# Patient Record
Sex: Male | Born: 1970 | Race: White | Hispanic: No | Marital: Married | Smoking: Never smoker
Health system: Southern US, Community
[De-identification: ages and names within clinical notes are randomized; demographics above are authoritative.]

## PROBLEM LIST (undated history)

## (undated) DIAGNOSIS — B019 Varicella without complication: Secondary | ICD-10-CM

## (undated) DIAGNOSIS — J069 Acute upper respiratory infection, unspecified: Secondary | ICD-10-CM

## (undated) DIAGNOSIS — T783XXA Angioneurotic edema, initial encounter: Secondary | ICD-10-CM

## (undated) DIAGNOSIS — N200 Calculus of kidney: Secondary | ICD-10-CM

## (undated) DIAGNOSIS — I10 Essential (primary) hypertension: Secondary | ICD-10-CM

## (undated) DIAGNOSIS — T7840XA Allergy, unspecified, initial encounter: Secondary | ICD-10-CM

## (undated) HISTORY — DX: Varicella without complication: B01.9

## (undated) HISTORY — DX: Calculus of kidney: N20.0

## (undated) HISTORY — DX: Allergy, unspecified, initial encounter: T78.40XA

## (undated) HISTORY — DX: Acute upper respiratory infection, unspecified: J06.9

## (undated) HISTORY — DX: Angioneurotic edema, initial encounter: T78.3XXA

## (undated) HISTORY — PX: KNEE SURGERY: SHX244

---

## 2014-12-21 ENCOUNTER — Encounter (HOSPITAL_COMMUNITY): Payer: Self-pay | Admitting: Emergency Medicine

## 2014-12-21 ENCOUNTER — Emergency Department (HOSPITAL_COMMUNITY): Payer: Worker's Compensation

## 2014-12-21 ENCOUNTER — Emergency Department (HOSPITAL_COMMUNITY)
Admission: EM | Admit: 2014-12-21 | Discharge: 2014-12-21 | Disposition: A | Discharge status: Home / Self Care | Payer: Worker's Compensation | Attending: Emergency Medicine | Admitting: Emergency Medicine

## 2014-12-21 DIAGNOSIS — I10 Essential (primary) hypertension: Secondary | ICD-10-CM | POA: Diagnosis not present

## 2014-12-21 DIAGNOSIS — Z87891 Personal history of nicotine dependence: Secondary | ICD-10-CM | POA: Insufficient documentation

## 2014-12-21 DIAGNOSIS — Y9289 Other specified places as the place of occurrence of the external cause: Secondary | ICD-10-CM | POA: Diagnosis not present

## 2014-12-21 DIAGNOSIS — Y9389 Activity, other specified: Secondary | ICD-10-CM | POA: Diagnosis not present

## 2014-12-21 DIAGNOSIS — W000XXA Fall on same level due to ice and snow, initial encounter: Secondary | ICD-10-CM | POA: Insufficient documentation

## 2014-12-21 DIAGNOSIS — S79912A Unspecified injury of left hip, initial encounter: Secondary | ICD-10-CM | POA: Insufficient documentation

## 2014-12-21 DIAGNOSIS — Z880 Allergy status to penicillin: Secondary | ICD-10-CM | POA: Insufficient documentation

## 2014-12-21 DIAGNOSIS — Y998 Other external cause status: Secondary | ICD-10-CM | POA: Diagnosis not present

## 2014-12-21 DIAGNOSIS — S5002XA Contusion of left elbow, initial encounter: Secondary | ICD-10-CM | POA: Diagnosis not present

## 2014-12-21 DIAGNOSIS — W102XXA Fall (on)(from) incline, initial encounter: Secondary | ICD-10-CM

## 2014-12-21 DIAGNOSIS — M25552 Pain in left hip: Secondary | ICD-10-CM

## 2014-12-21 HISTORY — DX: Essential (primary) hypertension: I10

## 2014-12-21 MED ORDER — IBUPROFEN 800 MG PO TABS: 800.0000 mg | ORAL_TABLET | Freq: Three times a day (TID) | ORAL | Status: DC

## 2014-12-21 MED ORDER — HYDROCODONE-ACETAMINOPHEN 5-325 MG PO TABS: 1.0000 | ORAL_TABLET | ORAL | Status: DC | PRN

## 2014-12-21 MED ORDER — IBUPROFEN 800 MG PO TABS
800.0000 mg | ORAL_TABLET | Freq: Once | ORAL | Status: AC
  Administered 2014-12-21: 800 mg via ORAL
  Filled 2014-12-21: qty 1

## 2014-12-21 MED ORDER — CYCLOBENZAPRINE HCL 10 MG PO TABS: 10.0000 mg | ORAL_TABLET | Freq: Two times a day (BID) | ORAL | Status: DC | PRN

## 2014-12-21 NOTE — ED Provider Notes (Signed)
CSN: [id number]     Arrival date & time 12/21/14  0022 History   First MD Initiated Contact with Patient 12/21/14 0029     Chief Complaint  Patient presents with  . Fall     (Consider location/radiation/quality/duration/timing/severity/associated sxs/prior Treatment) Patient is a 44 y.o. male presenting with fall. The history is provided by the patient. No language interpreter was used.  Fall This is a new problem. The current episode started today. Pertinent negatives include no abdominal pain, chest pain, chills, fever, nausea or neck pain. 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 He complains of pain localized to these areas only. No head injury, neck pain, chest/abdominal pain. .    Past Medical History  Diagnosis Date  . Hypertension    Past Surgical History  Procedure Laterality Date  . Knee surgery     No family history on file. History  Substance Use Topics  . Smoking status: Former Games developermoker  . Smokeless tobacco: Current User    Types: Chew  . Alcohol Use: Yes    Review of Systems  Constitutional: Negative for fever and chills.  Respiratory: Negative.  Negative for shortness of breath.   Cardiovascular: Negative.  Negative for chest pain.  Gastrointestinal: Negative.  Negative for nausea and abdominal pain.  Musculoskeletal: Negative for neck pain.       See HPI.  Skin: Negative.  Negative for wound.  Neurological: Negative.       Allergies  Penicillins  Home Medications   Prior to Admission medications   Not on File   BP 122/85 mmHg  Pulse 116  Resp 25  SpO2 99% Physical Exam  Constitutional: He is oriented to person, place, and time. He appears well-developed and well-nourished.  HENT:  Head: Normocephalic.  Neck: Normal range of motion. Neck supple.  Cardiovascular: Normal rate and regular rhythm.   Pulmonary/Chest:  Effort normal and breath sounds normal.  Abdominal: Soft. Bowel sounds are normal. There is no tenderness. There is no rebound and no guarding.  Musculoskeletal: Normal range of motion.  Tender lateral and posterior left hip without deformity, shortening or rotation of the left leg. Left elbow has a painful range of motion, but full. No significant swelling. Point tenderness to olecranon process.   Neurological: He is alert and oriented to person, place, and time.  Skin: Skin is warm and dry. No rash noted.  Psychiatric: He has a normal mood and affect.    ED Course  Procedures (including critical care time) Labs Review Labs Reviewed - No data to display  Imaging Review No results found.   EKG Interpretation None     Dg Elbow Complete Left  12/21/2014   CLINICAL DATA:  Larey SeatFell on ice tonight, landing on left side.  EXAM: LEFT ELBOW - COMPLETE 3+ VIEW  COMPARISON:  None.  FINDINGS: There is no evidence of fracture, dislocation, or joint effusion. There is no evidence of arthropathy or other focal bone abnormality. Soft tissues are unremarkable.  IMPRESSION: Negative.   Electronically Signed   By: Ellery Plunkaniel R Mitchell M.D.   On: 12/21/2014 01:19   Dg Hip Unilat With Pelvis 2-3 Views Left  12/21/2014   CLINICAL DATA:  Larey SeatFell on ice tonight, landing on left side.  EXAM: LEFT HIP (WITH PELVIS) 2-3 VIEWS  COMPARISON:  None.  FINDINGS: There is no fracture or dislocation about the left hip. Left  hemipelvis is intact. No acute soft tissue abnormality is evident.  IMPRESSION: Negative for acute fracture.   Electronically Signed   By: Ellery Plunk M.D.   On: 12/21/2014 01:18    MDM   Final diagnoses:  Fall (on)(from) incline, initial encounter  hip pain  Negative imaging for fracture injuries. He is well appearing. Able to move all extremities and in NAD. VSS.     Arnoldo Hooker, PA-C 12/21/14 1610  Olivia Mackie, MD 12/21/14 [phone]

## 2014-12-21 NOTE — Discharge Instructions (Signed)
Cryotherapy °Cryotherapy means treatment with cold. Ice or gel packs can be used to reduce both pain and swelling. Ice is the most helpful within the first 24 to 48 hours after an injury or flare-up from overusing a muscle or joint. Sprains, strains, spasms, burning pain, shooting pain, and aches can all be eased with ice. Ice can also be used when recovering from surgery. Ice is effective, has very few side effects, and is safe for most people to use. °PRECAUTIONS  °Ice is not a safe treatment option for people with: °· Raynaud phenomenon. This is a condition affecting small blood vessels in the extremities. Exposure to cold may cause your problems to return. °· Cold hypersensitivity. There are many forms of cold hypersensitivity, including: °¨ Cold urticaria. Red, itchy hives appear on the skin when the tissues begin to warm after being iced. °¨ Cold erythema. This is a red, itchy rash caused by exposure to cold. °¨ Cold hemoglobinuria. Red blood cells break down when the tissues begin to warm after being iced. The hemoglobin that carry oxygen are passed into the urine because they cannot combine with blood proteins fast enough. °· Numbness or altered sensitivity in the area being iced. °If you have any of the following conditions, do not use ice until you have discussed cryotherapy with your caregiver: °· Heart conditions, such as arrhythmia, angina, or chronic heart disease. °· High blood pressure. °· Healing wounds or open skin in the area being iced. °· Current infections. °· Rheumatoid arthritis. °· Poor circulation. °· Diabetes. °Ice slows the blood flow in the region it is applied. This is beneficial when trying to stop inflamed tissues from spreading irritating chemicals to surrounding tissues. However, if you expose your skin to cold temperatures for too long or without the proper protection, you can damage your skin or nerves. Watch for signs of skin damage due to cold. °HOME CARE INSTRUCTIONS °Follow  these tips to use ice and cold packs safely. °· Place a dry or damp towel between the ice and skin. A damp towel will cool the skin more quickly, so you may need to shorten the time that the ice is used. °· For a more rapid response, add gentle compression to the ice. °· Ice for no more than 10 to 20 minutes at a time. The bonier the area you are icing, the less time it will take to get the benefits of ice. °· Check your skin after 5 minutes to make sure there are no signs of a poor response to cold or skin damage. °· Rest 20 minutes or more between uses. °· Once your skin is numb, you can end your treatment. You can test numbness by very lightly touching your skin. The touch should be so light that you do not see the skin dimple from the pressure of your fingertip. When using ice, most people will feel these normal sensations in this order: cold, burning, aching, and numbness. °· Do not use ice on someone who cannot communicate their responses to pain, such as small children or people with dementia. °HOW TO MAKE AN ICE PACK °Ice packs are the most common way to use ice therapy. Other methods include ice massage, ice baths, and cryosprays. Muscle creams that cause a cold, tingly feeling do not offer the same benefits that ice offers and should not be used as a substitute unless recommended by your caregiver. °To make an ice pack, do one of the following: °· Place crushed ice or a   bag of frozen vegetables in a sealable plastic bag. Squeeze out the excess air. Place this bag inside another plastic bag. Slide the bag into a pillowcase or place a damp towel between your skin and the bag. °· Mix 3 parts water with 1 part rubbing alcohol. Freeze the mixture in a sealable plastic bag. When you remove the mixture from the freezer, it will be slushy. Squeeze out the excess air. Place this bag inside another plastic bag. Slide the bag into a pillowcase or place a damp towel between your skin and the bag. °SEEK MEDICAL CARE  IF: °· You develop white spots on your skin. This may give the skin a blotchy (mottled) appearance. °· Your skin turns blue or pale. °· Your skin becomes waxy or hard. °· Your swelling gets worse. °MAKE SURE YOU:  °· Understand these instructions. °· Will watch your condition. °· Will get help right away if you are not doing well or get worse. °Document Released: 06/18/2011 Document Revised: 03/08/2014 Document Reviewed: 06/18/2011 °ExitCare® Patient Information ©2015 [employer]. This information is not intended to replace advice given to you by your health care provider. Make sure you discuss any questions you have with your health care provider. ° °

## 2014-12-21 NOTE — ED Notes (Signed)
Pt. slipped and fell this evening , reports left lower back pain and left elbow pain . No LOC / ambulatory.

## 2015-01-03 ENCOUNTER — Encounter: Payer: Self-pay | Admitting: Family

## 2015-01-03 ENCOUNTER — Ambulatory Visit (INDEPENDENT_AMBULATORY_CARE_PROVIDER_SITE_OTHER): Payer: No Typology Code available for payment source | Admitting: Family

## 2015-01-03 VITALS — BP 130/98 | HR 108 | Temp 98.5°F | Resp 18 | Ht 71.5 in | Wt 248.0 lb

## 2015-01-03 DIAGNOSIS — I10 Essential (primary) hypertension: Secondary | ICD-10-CM | POA: Insufficient documentation

## 2015-01-03 DIAGNOSIS — Z23 Encounter for immunization: Secondary | ICD-10-CM

## 2015-01-03 MED ORDER — HYDROCHLOROTHIAZIDE 12.5 MG PO CAPS: 12.5000 mg | ORAL_CAPSULE | Freq: Every day | ORAL | Status: DC

## 2015-01-03 NOTE — Patient Instructions (Addendum)
Thank you for choosing [facility].  Summary/Instructions:  Please schedule a time for your physical.  Your prescription(s) have been submitted to your pharmacy or been printed and provided for you. Please take as directed and contact our office if you believe you are having problem(s) with the medication(s) or have any questions.  If your symptoms worsen or fail to improve, please contact our office for further instruction, or in case of emergency go directly to the emergency room at the closest medical facility.    Hydrochlorothiazide, HCTZ capsules or tablets What is this medicine? HYDROCHLOROTHIAZIDE (hye droe klor oh THYE a zide) is a diuretic. It increases the amount of urine passed, which causes the body to lose salt and water. This medicine is used to treat high blood pressure. It is also reduces the swelling and water retention caused by various medical conditions, such as heart, liver, or kidney disease. This medicine may be used for other purposes; ask your health care provider or pharmacist if you have questions. COMMON BRAND NAME(S): Esidrix, Ezide, HydroDIURIL, Microzide, Oretic, Zide What should I tell my health care provider before I take this medicine? They need to know if you have any of these conditions: -diabetes -gout -immune system problems, like lupus -kidney disease or kidney stones -liver disease -pancreatitis -small amount of urine or difficulty passing urine -an unusual or allergic reaction to hydrochlorothiazide, sulfa drugs, other medicines, foods, dyes, or preservatives -pregnant or trying to get pregnant -breast-feeding How should I use this medicine? Take this medicine by mouth with a glass of water. Follow the directions on the prescription label. Take your medicine at regular intervals. Remember that you will need to pass urine frequently after taking this medicine. Do not take your doses at a time of day that will cause you problems. Do not stop  taking your medicine unless your doctor tells you to. Talk to your pediatrician regarding the use of this medicine in children. Special care may be needed. Overdosage: If you think you have taken too much of this medicine contact a poison control center or emergency room at once. NOTE: This medicine is only for you. Do not share this medicine with others. What if I miss a dose? If you miss a dose, take it as soon as you can. If it is almost time for your next dose, take only that dose. Do not take double or extra doses. What may interact with this medicine? -cholestyramine -colestipol -digoxin -dofetilide -lithium -medicines for blood pressure -medicines for diabetes -medicines that relax muscles for surgery -other diuretics -steroid medicines like prednisone or cortisone This list may not describe all possible interactions. Give your health care provider a list of all the medicines, herbs, non-prescription drugs, or dietary supplements you use. Also tell them if you smoke, drink alcohol, or use illegal drugs. Some items may interact with your medicine. What should I watch for while using this medicine? Visit your doctor or health care professional for regular checks on your progress. Check your blood pressure as directed. Ask your doctor or health care professional what your blood pressure should be and when you should contact him or her. You may need to be on a special diet while taking this medicine. Ask your doctor. Check with your doctor or health care professional if you get an attack of severe diarrhea, nausea and vomiting, or if you sweat a lot. The loss of too much body fluid can make it dangerous for you to take this medicine. You may  get drowsy or dizzy. Do not drive, use machinery, or do anything that needs mental alertness until you know how this medicine affects you. Do not stand or sit up quickly, especially if you are an older patient. This reduces the risk of dizzy or fainting  spells. Alcohol may interfere with the effect of this medicine. Avoid alcoholic drinks. This medicine may affect your blood sugar level. If you have diabetes, check with your doctor or health care professional before changing the dose of your diabetic medicine. This medicine can make you more sensitive to the sun. Keep out of the sun. If you cannot avoid being in the sun, wear protective clothing and use sunscreen. Do not use sun lamps or tanning beds/booths. What side effects may I notice from receiving this medicine? Side effects that you should report to your doctor or health care professional as soon as possible: -allergic reactions such as skin rash or itching, hives, swelling of the lips, mouth, tongue, or throat -changes in vision -chest pain -eye pain -fast or irregular heartbeat -feeling faint or lightheaded, falls -gout attack -muscle pain or cramps -pain or difficulty when passing urine -pain, tingling, numbness in the hands or feet -redness, blistering, peeling or loosening of the skin, including inside the mouth -unusually weak or tired Side effects that usually do not require medical attention (report to your doctor or health care professional if they continue or are bothersome): -change in sex drive or performance -dry mouth -headache -stomach upset This list may not describe all possible side effects. Call your doctor for medical advice about side effects. You may report side effects to FDA at 1-800-FDA-1088. Where should I keep my medicine? Keep out of the reach of children. Store at room temperature between 15 and 30 degrees C (59 and 86 degrees F). Do not freeze. Protect from light and moisture. Keep container closed tightly. Throw away any unused medicine after the expiration date. NOTE: This sheet is a summary. It may not cover all possible information. If you have questions about this medicine, talk to your doctor, pharmacist, or health care provider.  2015,  Elsevier/Gold Standard. (2010-06-16 12:57:37)  Smokeless Tobacco Use Smokeless tobacco is a loose, fine, or stringy tobacco. The tobacco is not smoked like a cigarette, but it is chewed or held in the lips or cheeks. It resembles tea and comes from the leaves of the tobacco plant. Smokeless tobacco is usually flavored, sweetened, or processed in some way. Although smokeless tobacco is not smoked into the lungs, its chemicals are absorbed through the membranes in the mouth and into the bloodstream. Its chemicals are also swallowed in saliva. The chemicals (nicotine and other toxins) are known to cause cancer. Smokeless tobacco contains up to 28 differentcarcinogens. CAUSES Nicotine is addictive. Smokeless tobacco contains nicotine, which is a stimulant. This stimulant can give you a "buzz" or altered state. People can become addicted to the feeling it delivers.  SYMPTOMS Smokeless tobacco can cause health problems, including:  Bad breath.  Yellow-brown teeth.  Mouth sores.  Cracking and bleeding lips.  Gum disease, gum recession, and bone loss around the teeth.  Tooth decay.  Increased or irregular heart rate.  High blood pressure, heart disease, and stroke.  Cancer of the mouth, lips, tongue, pancreas, voice box (larynx), esophagus, colon, and bladder.  Precancerous lesion of the soft tissues of the mouth (leukoplakia).  Loss of your sense of taste. TREATMENT Talk with your caregiver about ways you can quit. Quitting tobacco is a good  decision for your health. Nicotine is addictive, but several options are available to help you quit including:  Nicotine replacement therapy (gum or patch).  Support and cessation programs. The following tips can help you quit:  Write down the reasons you would like to quit and look at them often.  Set a date during a low stress time to stop or cut back.  Ask family and friends for their support.  Remove all tobacco products from your  home and work.  Replace the chewing tobacco with things like beef jerky, sunflower seeds, or shredded coconut.  Avoid situations that may make you want to chew tobacco.  Exercise and eat a healthy diet.  When you crave tobacco, distract yourself with drinking water, sugarless chewing gum, sugarless hard candy, exercising, or deep breathing. HOME CARE INSTRUCTIONS  See your dentist for regular oral health exams every 6 months.  Follow up with your caregiver as recommended. SEEK MEDICAL CARE OR DENTAL CARE IF:  You have bleeding or cracking lips, gums, or cheeks.  You have mouth sores, discolorations, or pain.  You have tooth pain.  You develop persistent irritation, burning, or sores in the mouth.  You have pain, tenderness, or numbness in the mouth.  You develop a lump, bumpy patch, or hardened skin inside the mouth.  The color changes inside your mouth (gray, white, or red spots).  You have difficulty chewing, swallowing, or speaking. Document Released: 03/26/2011 Document Revised: 01/14/2012 Document Reviewed: 03/26/2011 [facility] Patient Information 2015 [place], [place]. This information is not intended to replace advice given to you by your health care provider. Make sure you discuss any questions you have with your health care provider.

## 2015-01-03 NOTE — Progress Notes (Signed)
Pre visit review using our clinic review tool, if applicable. No additional management support is needed unless otherwise documented below in the visit note. 

## 2015-01-03 NOTE — Progress Notes (Signed)
Subjective:    Patient ID: Joel [name], male    DOB: 09-Jan-1971, 44 y.o.   MRN: [id number]  Chief Complaint  Patient presents with  . Establish Care    concerned about his BP says the numbers have been reading high, has started in the last year    HPI:  Joel [name] is a 44 y.o. male who presents today to establish care.   Indicates that he experiences the associated symptom of headaches which he relates to increases in his blood pressure that have increased in frequency especially in the last month. 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 Headaches are described as achy with an intensity of 6/10 on average. Usually takes ibuprofen which tends to get rid of his headache.   BP Readings from Last 3 Encounters:  01/03/15 130/98  12/21/14 108/66    Allergies  Allergen Reactions  . Penicillins Anaphylaxis    Current Outpatient Prescriptions on File Prior to Visit  Medication Sig Dispense Refill  . cyclobenzaprine (FLEXERIL) 10 MG tablet Take 1 tablet (10 mg total) by mouth 2 (two) times daily as needed for muscle spasms. 20 tablet 0  . ibuprofen (ADVIL,MOTRIN) 800 MG tablet Take 1 tablet (800 mg total) by mouth 3 (three) times daily. 21 tablet 0   No current facility-administered medications on file prior to visit.    Past Medical History  Diagnosis Date  . Hypertension   . Chicken pox   . Allergy   . Kidney stones     Past Surgical History  Procedure Laterality Date  . Knee surgery      Family History  Problem Relation Age of Onset  . Heart attack Mother   . Ovarian cancer Mother   . Arthritis Mother   . Leukemia Mother   . Heart attack Father   . Heart attack Maternal Grandmother   . Arthritis Maternal Grandmother   . Diabetes Maternal Grandmother   . Hypertension Maternal Grandmother   . Stroke Paternal Grandmother     History   Social History  . Marital Status: Single    Spouse Name: N/A  .  Number of Children: 0  . Years of Education: 12   Occupational History  . Security    Social History Main Topics  . Smoking status: Never Smoker   . Smokeless tobacco: Current User    Types: Chew  . Alcohol Use: Yes     Comment: Rarely  . Drug Use: No  . Sexual Activity: Not on file   Other Topics Concern  . Not on file   Social History Narrative   [place], [place] and [place], [place]. Currently resides in an apartment. 2 dogs. Fun: Shoot   Denies religious beliefs that would effect health care.     Review of Systems  Eyes:       Denies changes in vision.   Respiratory: Negative for chest tightness and shortness of breath.   Cardiovascular: Negative for chest pain, palpitations and leg swelling.      Objective:    BP 130/98 mmHg  Pulse 108  Temp(Src) 98.5 F (36.9 C) (Oral)  Resp 18  Ht 5' 11.5" (1.816 m)  Wt 248 lb (112.492 kg)  BMI 34.11 kg/m2  SpO2 96% Nursing note and vital signs reviewed.  Physical Exam  Constitutional: He is oriented to person, place, and time. He appears well-developed and well-nourished. No distress.  Cardiovascular:  Normal rate, regular rhythm, normal heart sounds and intact distal pulses.   Pulmonary/Chest: Effort normal and breath sounds normal.  Neurological: He is alert and oriented to person, place, and time.  Skin: Skin is warm and dry.  Psychiatric: He has a normal mood and affect. His behavior is normal. Judgment and thought content normal.       Assessment & Plan:

## 2015-01-03 NOTE — Assessment & Plan Note (Signed)
Headaches are mostly likely related to increases of stress and blood pressure. Given family history of cardiovascular disease and previous blood pressure treatments as a teenager, start HCTZ. Follow up in 1 month.

## 2015-01-04 ENCOUNTER — Telehealth: Payer: Self-pay | Admitting: Family

## 2015-01-04 NOTE — Telephone Encounter (Signed)
emmi mailed  °

## 2015-02-01 ENCOUNTER — Telehealth: Payer: Self-pay | Admitting: Family

## 2015-02-01 ENCOUNTER — Ambulatory Visit (INDEPENDENT_AMBULATORY_CARE_PROVIDER_SITE_OTHER): Payer: No Typology Code available for payment source | Admitting: Family

## 2015-02-01 ENCOUNTER — Encounter: Payer: Self-pay | Admitting: Family

## 2015-02-01 VITALS — BP 122/88 | HR 76 | Temp 98.4°F | Resp 18 | Ht 71.5 in | Wt 251.2 lb

## 2015-02-01 DIAGNOSIS — I10 Essential (primary) hypertension: Secondary | ICD-10-CM

## 2015-02-01 MED ORDER — HYDROCHLOROTHIAZIDE 12.5 MG PO CAPS: 12.5000 mg | ORAL_CAPSULE | Freq: Every day | ORAL | Status: DC

## 2015-02-01 MED ORDER — IBUPROFEN 800 MG PO TABS: 800.0000 mg | ORAL_TABLET | Freq: Three times a day (TID) | ORAL | Status: DC

## 2015-02-01 NOTE — Progress Notes (Signed)
   Subjective:    Patient ID: Joel [name], male    DOB: 04-Mar-1971, 44 y.o.   MRN: [id number]  Chief Complaint  Patient presents with  . Follow-up    still concerned about his bottom number of his BP being a little high, but HCTZ has helped enough to get rid of headaches,     HPI:  Joel [name] is a 44 y.o. male who presents today for follow up.  1) Blood pressure - Was recently started on HCTZ secondary to increased blood pressure and potentially associated headaches. Currently treated with HCTZ 12.5 mg and notes improvement in his blood pressure numbers and a decrease in his headaches. Notes readings at home in the 130's/80s. Denies any associated side effects.   BP Readings from Last 3 Encounters:  02/01/15 122/88  01/03/15 130/98  12/21/14 108/66    Allergies  Allergen Reactions  . Penicillins Anaphylaxis    Current Outpatient Prescriptions on File Prior to Visit  Medication Sig Dispense Refill  . cyclobenzaprine (FLEXERIL) 10 MG tablet Take 1 tablet (10 mg total) by mouth 2 (two) times daily as needed for muscle spasms. 20 tablet 0  . hydrochlorothiazide (MICROZIDE) 12.5 MG capsule Take 1 capsule (12.5 mg total) by mouth daily. 30 capsule 2  . ibuprofen (ADVIL,MOTRIN) 800 MG tablet Take 1 tablet (800 mg total) by mouth 3 (three) times daily. 21 tablet 0   No current facility-administered medications on file prior to visit.     Review of Systems  Respiratory: Negative for chest tightness and shortness of breath.   Cardiovascular: Negative for chest pain, palpitations and leg swelling.  Neurological: Negative for headaches.      Objective:    BP 122/88 mmHg  Pulse 76  Temp(Src) 98.4 F (36.9 C) (Oral)  Resp 18  Ht 5' 11.5" (1.816 m)  Wt 251 lb 3.2 oz (113.944 kg)  BMI 34.55 kg/m2  SpO2 97% Nursing note and vital signs reviewed.  Physical Exam  Constitutional: He is oriented to person, place, and time. He appears well-developed and well-nourished.  No distress.  Cardiovascular: Normal rate, regular rhythm, normal heart sounds and intact distal pulses.   Pulmonary/Chest: Effort normal and breath sounds normal.  Neurological: He is alert and oriented to person, place, and time.  Skin: Skin is warm and dry.  Psychiatric: He has a normal mood and affect. His behavior is normal. Judgment and thought content normal.       Assessment & Plan:

## 2015-02-01 NOTE — Progress Notes (Signed)
Pre visit review using our clinic review tool, if applicable. No additional management support is needed unless otherwise documented below in the visit note. 

## 2015-02-01 NOTE — Assessment & Plan Note (Signed)
Stable with current regimen. Blood pressure is below goal of 140/90. Continue current dosage of hydrochlorothiazide.

## 2015-02-01 NOTE — Patient Instructions (Addendum)
Thank you for choosing [facility].  Summary/Instructions:  Your prescription(s) have been submitted to your pharmacy or been printed and provided for you. Please take as directed and contact our office if you believe you are having problem(s) with the medication(s) or have any questions.  If your symptoms worsen or fail to improve, please contact our office for further instruction, or in case of emergency go directly to the emergency room at the closest medical facility.     

## 2015-02-01 NOTE — Telephone Encounter (Signed)
emmi emailed °

## 2015-02-10 ENCOUNTER — Telehealth: Payer: Self-pay | Admitting: Family

## 2015-02-10 ENCOUNTER — Encounter: Payer: Self-pay | Admitting: Family

## 2015-02-10 ENCOUNTER — Other Ambulatory Visit (INDEPENDENT_AMBULATORY_CARE_PROVIDER_SITE_OTHER): Payer: No Typology Code available for payment source

## 2015-02-10 ENCOUNTER — Ambulatory Visit (INDEPENDENT_AMBULATORY_CARE_PROVIDER_SITE_OTHER): Payer: No Typology Code available for payment source | Admitting: Family

## 2015-02-10 VITALS — BP 110/78 | HR 91 | Temp 98.1°F | Resp 18 | Ht 71.5 in | Wt 252.0 lb

## 2015-02-10 DIAGNOSIS — I10 Essential (primary) hypertension: Secondary | ICD-10-CM | POA: Diagnosis not present

## 2015-02-10 DIAGNOSIS — Z0000 Encounter for general adult medical examination without abnormal findings: Secondary | ICD-10-CM | POA: Insufficient documentation

## 2015-02-10 DIAGNOSIS — F329 Major depressive disorder, single episode, unspecified: Secondary | ICD-10-CM | POA: Diagnosis not present

## 2015-02-10 DIAGNOSIS — Z23 Encounter for immunization: Secondary | ICD-10-CM

## 2015-02-10 DIAGNOSIS — F419 Anxiety disorder, unspecified: Secondary | ICD-10-CM

## 2015-02-10 DIAGNOSIS — F32A Depression, unspecified: Secondary | ICD-10-CM

## 2015-02-10 LAB — LIPID PANEL
CHOL/HDL RATIO: 4
CHOLESTEROL: 194 mg/dL (ref 0–200)
HDL: 49.9 mg/dL (ref >39.00)
LDL CALC: 131 mg/dL — AB (ref 0–99)
NonHDL: 144.1
Triglycerides: 65 mg/dL (ref 0.0–149.0)
VLDL: 13 mg/dL (ref 0.0–40.0)

## 2015-02-10 LAB — BASIC METABOLIC PANEL
BUN: 14 mg/dL (ref 6–23)
CALCIUM: 9.5 mg/dL (ref 8.4–10.5)
CHLORIDE: 99 meq/L (ref 96–112)
CO2: 31 meq/L (ref 19–32)
Creatinine, Ser: 1.18 mg/dL (ref 0.40–1.50)
GFR: 70.88 mL/min (ref >60.00)
Glucose, Bld: 108 mg/dL — ABNORMAL HIGH (ref 70–99)
Potassium: 4.4 mEq/L (ref 3.5–5.1)
Sodium: 135 mEq/L (ref 135–145)

## 2015-02-10 LAB — CBC
HCT: 42.9 % (ref 39.0–52.0)
HEMOGLOBIN: 14.8 g/dL (ref 13.0–17.0)
MCHC: 34.5 g/dL (ref 30.0–36.0)
MCV: 91.2 fl (ref 78.0–100.0)
PLATELETS: 293 10*3/uL (ref 150.0–400.0)
RBC: 4.7 Mil/uL (ref 4.22–5.81)
RDW: 13 % (ref 11.5–15.5)
WBC: 10.2 10*3/uL (ref 4.0–10.5)

## 2015-02-10 LAB — PSA: PSA: 0.49 ng/mL (ref 0.10–4.00)

## 2015-02-10 MED ORDER — SERTRALINE HCL 50 MG PO TABS: 50.0000 mg | ORAL_TABLET | Freq: Every day | ORAL | Status: DC

## 2015-02-10 NOTE — Telephone Encounter (Signed)
Please inform the patient that his kidney function, electrolytes, white/red blood cells, and prostate are within normal limits. His LDL, or bad cholesterol, is slightly elevated. This does not require any medication at this time. Also his fasting blood sugar was slightly elevated placing him in the prediabetic range. Therefore for both of these I would recommend increasing the nutrient density of your diet through increased fruit, vegetable and fiber intake while decreasing saturated fats and processed foods. Also working towards 30 minutes of moderate physical activity most days of the week.

## 2015-02-10 NOTE — Assessment & Plan Note (Addendum)
1) Anticipatory Guidance: Discussed importance of wearing a seatbelt while driving and not texting while driving; changing batteries in smoke detector at least once annually; wearing suntan lotion when outside; eating a balanced and moderate diet; getting physical activity at least 30 minutes per day.  2) Immunizations / Screenings / Labs:  Tdap given today. All other immunizations are up to date per recommendations. Patient is due for a dental exam which he will schedule independently. All other screenings are up to date per recommendations. Obtain CBC, BMET, Lipid profile and TSH.    Overall well physical exam. Patient's cardiovascular risk factors include smokeless tobacco use, hypertension, and obesity. Discussed stopping smokeless tobacco usage and patient is currently contemplating doing so. Hypertension is well controlled with current regimen of hydrochlorothiazide. Continue current dosage of hydrochlorothiazide. Discussed with patient his body mass index placing him in the obese category. Recommend goal of 5-10% of body weight loss through increased nutrient density and increased physical activity. Follow up prevention exam in one year. Office visit follow-up pending lab work or in 6 months for blood pressure.

## 2015-02-10 NOTE — Progress Notes (Signed)
Pre visit review using our clinic review tool, if applicable. No additional management support is needed unless otherwise documented below in the visit note. 

## 2015-02-10 NOTE — Patient Instructions (Signed)
Thank you for choosing [facility].  Summary/Instructions:  Your prescription(s) have been submitted to your pharmacy or been printed and provided for you. Please take as directed and contact our office if you believe you are having problem(s) with the medication(s) or have any questions.  Please stop by the lab on the basement level of the building for your blood work. Your results will be released to MyChart (or called to you) after review, usually within 72 hours after test completion. If any changes need to be made, you will be notified at that same time.  If your symptoms worsen or fail to improve, please contact our office for further instruction, or in case of emergency go directly to the emergency room at the closest medical facility.   Health Maintenance A healthy lifestyle and preventative care can promote health and wellness. 1. Maintain regular health, dental, and eye exams. 2. Eat a healthy diet. Foods like vegetables, fruits, whole grains, low-fat dairy products, and lean protein foods contain the nutrients you need and are low in calories. Decrease your intake of foods high in solid fats, added sugars, and salt. Get information about a proper diet from your health care provider, if necessary. 3. Regular physical exercise is one of the most important things you can do for your health. Most adults should get at least 150 minutes of moderate-intensity exercise (any activity that increases your heart rate and causes you to sweat) each week. In addition, most adults need muscle-strengthening exercises on 2 or more days a week.  4. Maintain a healthy weight. The body mass index (BMI) is a screening tool to identify possible weight problems. It provides an estimate of body fat based on height and weight. Your health care provider can find your BMI and can help you achieve or maintain a healthy weight. For males 20 years and older: 1. A BMI below 18.5 is considered underweight. 2. A  BMI of 18.5 to 24.9 is normal. 3. A BMI of 25 to 29.9 is considered overweight. 4. A BMI of 30 and above is considered obese. 5. Maintain normal blood lipids and cholesterol by exercising and minimizing your intake of saturated fat. Eat a balanced diet with plenty of fruits and vegetables. Blood tests for lipids and cholesterol should begin at age 31 and be repeated every 5 years. If your lipid or cholesterol levels are high, you are over age 65, or you are at high risk for heart disease, you may need your cholesterol levels checked more frequently.Ongoing high lipid and cholesterol levels should be treated with medicines if diet and exercise are not working. 6. If you smoke, find out from your health care provider how to quit. If you do not use tobacco, do not start. 7. Lung cancer screening is recommended for adults aged 55-80 years who are at high risk for developing lung cancer because of a history of smoking. A yearly low-dose CT scan of the lungs is recommended for people who have at least a 30-pack-year history of smoking and are current smokers or have quit within the past 15 years. A pack year of smoking is smoking an average of 1 pack of cigarettes a day for 1 year (for example, a 30-pack-year history of smoking could mean smoking 1 pack a day for 30 years or 2 packs a day for 15 years). Yearly screening should continue until the smoker has stopped smoking for at least 15 years. Yearly screening should be stopped for people who develop a health  problem that would prevent them from having lung cancer treatment. 8. If you choose to drink alcohol, do not have more than 2 drinks per day. One drink is considered to be 12 oz (360 mL) of beer, 5 oz (150 mL) of wine, or 1.5 oz (45 mL) of liquor. 9. Avoid the use of street drugs. Do not share needles with anyone. Ask for help if you need support or instructions about stopping the use of drugs. 10. High blood pressure causes heart disease and increases the  risk of stroke. Blood pressure should be checked at least every 1-2 years. Ongoing high blood pressure should be treated with medicines if weight loss and exercise are not effective. 11. If you are 5145-263 years old, ask your health care provider if you should take aspirin to prevent heart disease. 12. Diabetes screening involves taking a blood sample to check your fasting blood sugar level. This should be done once every 3 years after age 45 if you are at a normal weight and without risk factors for diabetes. Testing should be considered at a younger age or be carried out more frequently if you are overweight and have at least 1 risk factor for diabetes. 13. Colorectal cancer can be detected and often prevented. Most routine colorectal cancer screening begins at the age of 45 and continues through age 775. However, your health care provider may recommend screening at an earlier age if you have risk factors for colon cancer. On a yearly basis, your health care provider may provide home test kits to check for hidden blood in the stool. A small camera at the end of a tube may be used to directly examine the colon (sigmoidoscopy or colonoscopy) to detect the earliest forms of colorectal cancer. Talk to your health care provider about this at age 45 when routine screening begins. A direct exam of the colon should be repeated every 5-10 years through age 45, unless early forms of precancerous polyps or small growths are found. 14. People who are at an increased risk for hepatitis B should be screened for this virus. You are considered at high risk for hepatitis B if: 1. You were born in a country where hepatitis B occurs often. Talk with your health care provider about which countries are considered high risk. 2. Your parents were born in a high-risk country and you have not received a shot to protect against hepatitis B (hepatitis B vaccine). 3. You have HIV or AIDS. 4. You use needles to inject street  drugs. 5. You live with, or have sex with, someone who has hepatitis B. 6. You are a man who has sex with other men (MSM). 7. You get hemodialysis treatment. 8. You take certain medicines for conditions like cancer, organ transplantation, and autoimmune conditions. 15. Hepatitis C blood testing is recommended for all people born from [id number] through 1966 and any individual with known risk factors for hepatitis C. 16. Healthy men should no longer receive prostate-specific antigen (PSA) blood tests as part of routine cancer screening. Talk to your health care provider about prostate cancer screening. 17. Testicular cancer screening is not recommended for adolescents or adult males who have no symptoms. Screening includes self-exam, a health care provider exam, and other screening tests. Consult with your health care provider about any symptoms you have or any concerns you have about testicular cancer. 18. Practice safe sex. Use condoms and avoid high-risk sexual practices to reduce the spread of sexually transmitted infections (STIs). 19. You should  be screened for STIs, including gonorrhea and chlamydia if: 1. You are sexually active and are younger than 24 years. 2. You are older than 24 years, and your health care provider tells you that you are at risk for this type of infection. 3. Your sexual activity has changed since you were last screened, and you are at an increased risk for chlamydia or gonorrhea. Ask your health care provider if you are at risk. 20. If you are at risk of being infected with HIV, it is recommended that you take a prescription medicine daily to prevent HIV infection. This is called pre-exposure prophylaxis (PrEP). You are considered at risk if: 1. You are a man who has sex with other men (MSM). 2. You are a heterosexual man who is sexually active with multiple partners. 3. You take drugs by injection. 4. You are sexually active with a partner who has HIV. 5. Talk with your  health care provider about whether you are at high risk of being infected with HIV. If you choose to begin PrEP, you should first be tested for HIV. You should then be tested every 3 months for as long as you are taking PrEP. 21. Use sunscreen. Apply sunscreen liberally and repeatedly throughout the day. You should seek shade when your shadow is shorter than you. Protect yourself by wearing long sleeves, pants, a wide-brimmed hat, and sunglasses year round whenever you are outdoors. 22. Tell your health care provider of new moles or changes in moles, especially if there is a change in shape or color. Also, tell your health care provider if a mole is larger than the size of a pencil eraser. 23. A one-time screening for abdominal aortic aneurysm (AAA) and surgical repair of large AAAs by ultrasound is recommended for men aged 65-75 years who are current or former smokers. 24. Stay current with your vaccines (immunizations). Document Released: 04/19/2008 Document Revised: 10/27/2013 Document Reviewed: 03/19/2011 [facility] Patient Information 2015 [place], [place]. This information is not intended to replace advice given to you by your health care provider. Make sure you discuss any questions you have with your health care provider.  Depression Depression refers to feeling sad, low, down in the dumps, blue, gloomy, or empty. In general, there are two kinds of depression: 25. Normal sadness or normal grief. This kind of depression is one that we all feel from time to time after upsetting life experiences, such as the loss of a job or the ending of a relationship. This kind of depression is considered normal, is short lived, and resolves within a few days to 2 weeks. Depression experienced after the loss of a loved one (bereavement) often lasts longer than 2 weeks but normally gets better with time. 26. Clinical depression. This kind of depression lasts longer than normal sadness or normal grief or interferes  with your ability to function at home, at work, and in school. It also interferes with your personal relationships. It affects almost every aspect of your life. Clinical depression is an illness. Symptoms of depression can also be caused by conditions other than those mentioned above, such as:  Physical illness. Some physical illnesses, including underactive thyroid gland (hypothyroidism), severe anemia, specific types of cancer, diabetes, uncontrolled seizures, heart and lung problems, strokes, and chronic pain are commonly associated with symptoms of depression.  Side effects of some prescription medicine. In some people, certain types of medicine can cause symptoms of depression.  Substance abuse. Abuse of alcohol and illicit drugs can cause symptoms  of depression. SYMPTOMS Symptoms of normal sadness and normal grief include the following:  Feeling sad or crying for short periods of time.  Not caring about anything (apathy).  Difficulty sleeping or sleeping too much.  No longer able to enjoy the things you used to enjoy.  Desire to be by oneself all the time (social isolation).  Lack of energy or motivation.  Difficulty concentrating or remembering.  Change in appetite or weight.  Restlessness or agitation. Symptoms of clinical depression include the same symptoms of normal sadness or normal grief and also the following symptoms:  Feeling sad or crying all the time.  Feelings of guilt or worthlessness.  Feelings of hopelessness or helplessness.  Thoughts of suicide or the desire to harm yourself (suicidal ideation).  Loss of touch with reality (psychotic symptoms). Seeing or hearing things that are not real (hallucinations) or having false beliefs about your life or the people around you (delusions and paranoia). DIAGNOSIS  The diagnosis of clinical depression is usually based on how bad the symptoms are and how long they have lasted. Your health care provider will also ask  you questions about your medical history and substance use to find out if physical illness, use of prescription medicine, or substance abuse is causing your depression. Your health care provider may also order blood tests. TREATMENT  Often, normal sadness and normal grief do not require treatment. However, sometimes antidepressant medicine is given for bereavement to ease the depressive symptoms until they resolve. The treatment for clinical depression depends on how bad the symptoms are but often includes antidepressant medicine, counseling with a mental health professional, or both. Your health care provider will help to determine what treatment is best for you. Depression caused by physical illness usually goes away with appropriate medical treatment of the illness. If prescription medicine is causing depression, talk with your health care provider about stopping the medicine, decreasing the dose, or changing to another medicine. Depression caused by the abuse of alcohol or illicit drugs goes away when you stop using these substances. Some adults need professional help in order to stop drinking or using drugs. SEEK IMMEDIATE MEDICAL CARE IF:  You have thoughts about hurting yourself or others.  You lose touch with reality (have psychotic symptoms).  You are taking medicine for depression and have a serious side effect. FOR MORE INFORMATION  National Alliance on Mental Illness: www.nami.[employer] of Mental Health: http://www.maynard.net/ Document Released: 10/19/2000 Document Revised: 03/08/2014 Document Reviewed: 01/21/2012 [facility] Patient Information 2015 [place], [place]. This information is not intended to replace advice given to you by your health care provider. Make sure you discuss any questions you have with your health care provider.

## 2015-02-10 NOTE — Telephone Encounter (Signed)
LVM for pt to call back.

## 2015-02-10 NOTE — Assessment & Plan Note (Signed)
Patient presents with signs of depression. This is related to family factors including coping with his mother's illness and work stresses. He denies suicidal ideations. Discussed risks and benefits of starting medication and potential side effects. Patient wishes to proceed. Start Zoloft. Patient will follow-up in 2 weeks with phone call to determine effectiveness of medication and possible need for increased. Follow-up office visit for depression in 1 month.

## 2015-02-10 NOTE — Progress Notes (Signed)
Subjective:    Patient ID: Joel [name], male    DOB: 1971-10-21, 44 y.o.   MRN: [id number]  Chief Complaint  Patient presents with  . CPE    Fasting    HPI:  Joel [name] is a 44 y.o. male who presents today for an annual wellness visit.   1) Health Maintenance -   Diet - Averages about 1-2 meals per day; Meat and bread; 2-3 cup of caffeine per day   Exercise - Work and caring for his mother.     2) Preventative Exams / Immunizations:  Dental -- Due for exam  Vision -- Due for exam   Health Maintenance  Topic Date Due  . HIV Screening  11/22/1984  . TETANUS/TDAP  11/22/1988  . INFLUENZA VACCINE  06/06/2015  Tetanus   Immunization History  Administered Date(s) Administered  . Influenza,inj,Quad PF,36+ Mos 01/03/2015    Allergies  Allergen Reactions  . Penicillins Anaphylaxis    Current Outpatient Prescriptions on File Prior to Visit  Medication Sig Dispense Refill  . cyclobenzaprine (FLEXERIL) 10 MG tablet Take 1 tablet (10 mg total) by mouth 2 (two) times daily as needed for muscle spasms. 20 tablet 0  . hydrochlorothiazide (MICROZIDE) 12.5 MG capsule Take 1 capsule (12.5 mg total) by mouth daily. 90 capsule 1  . ibuprofen (ADVIL,MOTRIN) 800 MG tablet Take 1 tablet (800 mg total) by mouth 3 (three) times daily. 90 tablet 3   No current facility-administered medications on file prior to visit.    Past Medical History  Diagnosis Date  . Hypertension   . Chicken pox   . Allergy   . Kidney stones     Past Surgical History  Procedure Laterality Date  . Knee surgery      Family History  Problem Relation Age of Onset  . Heart attack Mother   . Ovarian cancer Mother   . Arthritis Mother   . Leukemia Mother   . Heart attack Father   . Heart attack Maternal Grandmother   . Arthritis Maternal Grandmother   . Diabetes Maternal Grandmother   . Hypertension Maternal Grandmother   . Stroke Paternal Grandmother     History   Social  History  . Marital Status: Single    Spouse Name: N/A  . Number of Children: 0  . Years of Education: 12   Occupational History  . Security    Social History Main Topics  . Smoking status: Never Smoker   . Smokeless tobacco: Current User    Types: Chew  . Alcohol Use: Yes     Comment: Rarely  . Drug Use: No  . Sexual Activity: Not on file   Other Topics Concern  . Not on file   Social History Narrative   [place], [place] and raised in [place], [place]. Currently resides in an apartment. 2 dogs. Fun: Shoot   Denies religious beliefs that would effect health care.       Review of Systems  Constitutional: Denies fever, chills, fatigue, or significant weight gain/loss. HENT: Head: Denies headache or neck pain Ears: Denies changes in hearing, ringing in ears, earache, drainage Nose: Denies discharge, stuffiness, itching, nosebleed, sinus pain Throat: Denies sore throat, hoarseness, dry mouth, sores, thrush Eyes: Denies loss/changes in vision, pain, redness, blurry/double vision, flashing lights Cardiovascular: Denies chest pain/discomfort, tightness, palpitations, shortness of breath with activity, difficulty lying down, swelling, sudden awakening with shortness of breath Respiratory: Denies shortness of breath, cough, sputum production, wheezing Gastrointestinal: Denies  dysphasia, heartburn, change in appetite, nausea, change in bowel habits, rectal bleeding, constipation, diarrhea, yellow skin or eyes Genitourinary: Denies frequency, urgency, burning/pain, blood in urine, incontinence, change in urinary strength. Musculoskeletal: Denies muscle/joint pain, stiffness, back pain, redness or swelling of joints, trauma Skin: Denies rashes, lumps, itching, dryness, color changes, or hair/nail changes Neurological: Denies dizziness, fainting, seizures, weakness, numbness, tingling, tremor Psychiatric - Denies nervousness, stress, or memory loss Associated symptom of depression has  been going for several months since he found out his mother is dying. Denies trying any other medications / treatments at this time. Denies any suicidal ideations.  Endocrine: Denies heat or cold intolerance, sweating, frequent urination, excessive thirst, changes in appetite Hematologic: Denies ease of bruising or bleeding     Objective:    BP 110/78 mmHg  Pulse 91  Temp(Src) 98.1 F (36.7 C) (Oral)  Resp 18  Ht 5' 11.5" (1.816 m)  Wt 252 lb (114.306 kg)  BMI 34.66 kg/m2  SpO2 98% Nursing note and vital signs reviewed.   Depression screen PHQ 2/9 02/10/2015  Decreased Interest 2  Down, Depressed, Hopeless 2  PHQ - 2 Score 4  Altered sleeping 0  Tired, decreased energy 2  Change in appetite 0  Feeling bad or failure about yourself  0  Trouble concentrating 0  Moving slowly or fidgety/restless 0  Suicidal thoughts 0  PHQ-9 Score 6     Physical Exam  Constitutional: He is oriented to person, place, and time. He appears well-developed and well-nourished.  HENT:  Head: Normocephalic.  Right Ear: Hearing, tympanic membrane, external ear and ear canal normal.  Left Ear: Hearing, tympanic membrane, external ear and ear canal normal.  Nose: Nose normal.  Mouth/Throat: Uvula is midline, oropharynx is clear and moist and mucous membranes are normal.  Eyes: Conjunctivae and EOM are normal. Pupils are equal, round, and reactive to light.  Neck: Neck supple. No JVD present. No tracheal deviation present. No thyromegaly present.  Cardiovascular: Normal rate, regular rhythm, normal heart sounds and intact distal pulses.   Pulmonary/Chest: Effort normal and breath sounds normal.  Abdominal: Soft. Bowel sounds are normal. He exhibits no distension and no mass. There is no tenderness. There is no rebound and no guarding.  Musculoskeletal: Normal range of motion. He exhibits no edema or tenderness.  Lymphadenopathy:    He has no cervical adenopathy.  Neurological: He is alert and  oriented to person, place, and time. He has normal reflexes. No cranial nerve deficit. He exhibits normal muscle tone. Coordination normal.  Skin: Skin is warm and dry.  Psychiatric: He has a normal mood and affect. His behavior is normal. Judgment and thought content normal.       Assessment & Plan:

## 2015-02-16 NOTE — Telephone Encounter (Signed)
Sending results in the mail. 

## 2015-03-15 ENCOUNTER — Ambulatory Visit: Payer: No Typology Code available for payment source | Admitting: Family

## 2015-03-17 ENCOUNTER — Encounter: Payer: Self-pay | Admitting: Family

## 2015-03-17 ENCOUNTER — Ambulatory Visit (INDEPENDENT_AMBULATORY_CARE_PROVIDER_SITE_OTHER): Payer: No Typology Code available for payment source | Admitting: Family

## 2015-03-17 VITALS — BP 110/80 | HR 93 | Temp 98.4°F | Resp 18 | Ht 71.5 in | Wt 249.0 lb

## 2015-03-17 DIAGNOSIS — F329 Major depressive disorder, single episode, unspecified: Secondary | ICD-10-CM

## 2015-03-17 DIAGNOSIS — F419 Anxiety disorder, unspecified: Principal | ICD-10-CM

## 2015-03-17 DIAGNOSIS — F418 Other specified anxiety disorders: Secondary | ICD-10-CM | POA: Diagnosis not present

## 2015-03-17 MED ORDER — VENLAFAXINE HCL ER 37.5 MG PO CP24: 37.5000 mg | ORAL_CAPSULE | Freq: Every day | ORAL | Status: DC

## 2015-03-17 NOTE — Assessment & Plan Note (Signed)
Continues to experience symptoms of anxiety and depression with no improvements with the Zoloft. These factors are multifactorial between taking care of his mother and work. Discontinue Zoloft. Start Effexor. Follow-up if symptoms worsen or fail to improve.

## 2015-03-17 NOTE — Patient Instructions (Signed)
Thank you for choosing [facility].  Summary/Instructions:  Please STOP taking the ZOLFOT (SERTRALINE) and START taking EFFEXOR (VENLAFEXINE)  Your prescription(s) have been submitted to your pharmacy or been printed and provided for you. Please take as directed and contact our office if you believe you are having problem(s) with the medication(s) or have any questions.  If your symptoms worsen or fail to improve, please contact our office for further instruction, or in case of emergency go directly to the emergency room at the closest medical facility.

## 2015-03-17 NOTE — Progress Notes (Signed)
   Subjective:    Patient ID: Joel [name], male    DOB: [birth year], 44 y.o.   MRN: [id number]  Chief Complaint  Patient presents with  . Follow-up    does not feel any different with taking zoloft,     HPI:  Joel [name] is a 44 y.o. male with a PMH of hypertension and depression who presents today for a follow up office visit.   Recently started on Zoloft for depression and does not really note any differences in his mood. Takes the medication as prescribed and denies any adverse side effects of the medication. Continues to experience the associated symptoms of depressed mood and anxiety.   Allergies  Allergen Reactions  . Penicillins Anaphylaxis    Current Outpatient Prescriptions on File Prior to Visit  Medication Sig Dispense Refill  . cyclobenzaprine (FLEXERIL) 10 MG tablet Take 1 tablet (10 mg total) by mouth 2 (two) times daily as needed for muscle spasms. 20 tablet 0  . hydrochlorothiazide (MICROZIDE) 12.5 MG capsule Take 1 capsule (12.5 mg total) by mouth daily. 90 capsule 1  . ibuprofen (ADVIL,MOTRIN) 800 MG tablet Take 1 tablet (800 mg total) by mouth 3 (three) times daily. 90 tablet 3  . sertraline (ZOLOFT) 50 MG tablet Take 1 tablet (50 mg total) by mouth daily. 30 tablet 0   No current facility-administered medications on file prior to visit.    Review of Systems  Constitutional: Negative for unexpected weight change.  Psychiatric/Behavioral: Positive for dysphoric mood. The patient is nervous/anxious.       Objective:    BP 110/80 mmHg  Pulse 93  Temp(Src) 98.4 F (36.9 C) (Oral)  Resp 18  Ht 5' 11.5" (1.816 m)  Wt 249 lb (112.946 kg)  BMI 34.25 kg/m2  SpO2 98% Nursing note and vital signs reviewed.  Physical Exam  Constitutional: He is oriented to person, place, and time. He appears well-developed and well-nourished. No distress.  Cardiovascular: Normal rate, regular rhythm, normal heart sounds and intact distal pulses.   Pulmonary/Chest:  Effort normal and breath sounds normal.  Neurological: He is alert and oriented to person, place, and time.  Skin: Skin is warm and dry.  Psychiatric: He has a normal mood and affect. His behavior is normal. Judgment and thought content normal.       Assessment & Plan:

## 2015-03-17 NOTE — Progress Notes (Signed)
Pre visit review using our clinic review tool, if applicable. No additional management support is needed unless otherwise documented below in the visit note. 

## 2015-04-28 ENCOUNTER — Telehealth: Payer: Self-pay | Admitting: Family

## 2015-04-28 MED ORDER — VENLAFAXINE HCL ER 75 MG PO CP24: 75.0000 mg | ORAL_CAPSULE | Freq: Every day | ORAL | Status: DC

## 2015-04-28 NOTE — Telephone Encounter (Signed)
Medication increased and sent to pharmacy.

## 2015-04-28 NOTE — Telephone Encounter (Signed)
Patient is requesting a refill for effexor, but he is requesting the dosage to be upped.  Patient uses [facility] at [facility].

## 2015-04-28 NOTE — Telephone Encounter (Signed)
Pt aware.

## 2015-06-27 ENCOUNTER — Telehealth: Payer: Self-pay

## 2015-06-27 MED ORDER — VENLAFAXINE HCL ER 150 MG PO CP24: 150.0000 mg | ORAL_CAPSULE | Freq: Every day | ORAL | Status: DC

## 2015-06-27 NOTE — Addendum Note (Signed)
Addended by: Jeanine Luz D on: 06/27/2015 12:10 PM   Modules accepted: Orders

## 2015-06-27 NOTE — Telephone Encounter (Signed)
Medication increased and sent to pharmacy.  

## 2015-06-27 NOTE — Telephone Encounter (Signed)
Pt wants to know if he can get another increase in his effexor. Please advise.

## 2015-06-27 NOTE — Telephone Encounter (Signed)
Pt aware.

## 2015-08-04 ENCOUNTER — Ambulatory Visit: Payer: Self-pay | Admitting: Family

## 2015-11-10 ENCOUNTER — Encounter (HOSPITAL_BASED_OUTPATIENT_CLINIC_OR_DEPARTMENT_OTHER): Payer: Self-pay | Admitting: *Deleted

## 2015-11-10 ENCOUNTER — Emergency Department (HOSPITAL_BASED_OUTPATIENT_CLINIC_OR_DEPARTMENT_OTHER)
Admission: EM | Admit: 2015-11-10 | Discharge: 2015-11-10 | Disposition: A | Discharge status: Home / Self Care | Payer: No Typology Code available for payment source | Attending: Emergency Medicine | Admitting: Emergency Medicine

## 2015-11-10 DIAGNOSIS — S61012A Laceration without foreign body of left thumb without damage to nail, initial encounter: Secondary | ICD-10-CM | POA: Insufficient documentation

## 2015-11-10 DIAGNOSIS — Y9389 Activity, other specified: Secondary | ICD-10-CM | POA: Insufficient documentation

## 2015-11-10 DIAGNOSIS — Z79899 Other long term (current) drug therapy: Secondary | ICD-10-CM | POA: Insufficient documentation

## 2015-11-10 DIAGNOSIS — Y998 Other external cause status: Secondary | ICD-10-CM | POA: Insufficient documentation

## 2015-11-10 DIAGNOSIS — Z8619 Personal history of other infectious and parasitic diseases: Secondary | ICD-10-CM | POA: Insufficient documentation

## 2015-11-10 DIAGNOSIS — IMO0002 Reserved for concepts with insufficient information to code with codable children: Secondary | ICD-10-CM

## 2015-11-10 DIAGNOSIS — Z87442 Personal history of urinary calculi: Secondary | ICD-10-CM | POA: Insufficient documentation

## 2015-11-10 DIAGNOSIS — Y288XXA Contact with other sharp object, undetermined intent, initial encounter: Secondary | ICD-10-CM | POA: Insufficient documentation

## 2015-11-10 DIAGNOSIS — Y9289 Other specified places as the place of occurrence of the external cause: Secondary | ICD-10-CM | POA: Insufficient documentation

## 2015-11-10 DIAGNOSIS — I10 Essential (primary) hypertension: Secondary | ICD-10-CM | POA: Insufficient documentation

## 2015-11-10 DIAGNOSIS — Z880 Allergy status to penicillin: Secondary | ICD-10-CM | POA: Insufficient documentation

## 2015-11-10 MED ORDER — BACITRACIN ZINC 500 UNIT/GM EX OINT: 1.0000 "application " | TOPICAL_OINTMENT | Freq: Two times a day (BID) | CUTANEOUS | Status: DC

## 2015-11-10 MED ORDER — SODIUM BICARBONATE 4 % IV SOLN
5.0000 mL | Freq: Once | INTRAVENOUS | Status: AC
  Administered 2015-11-10: 5 mL via SUBCUTANEOUS
  Filled 2015-11-10: qty 5

## 2015-11-10 MED ORDER — LIDOCAINE HCL (PF) 1 % IJ SOLN
5.0000 mL | Freq: Once | INTRAMUSCULAR | Status: AC
  Administered 2015-11-10: 5 mL
  Filled 2015-11-10: qty 5

## 2015-11-10 NOTE — ED Provider Notes (Signed)
CSN: [id number]     Arrival date & time 11/10/15  1932 History  By signing my name below, I, Joel [name], attest that this documentation has been prepared under the direction and in the presence of [name], PA-C Electronically Signed: Jarvis Morganaylor [name], ED Scribe. 11/10/2015. 10:24 PM.    Chief Complaint  Patient presents with  . Extremity Laceration    The history is provided by the patient. No language interpreter was used.    HPI Comments: Joel [name] is a 45 y.o. male who presents to the Emergency Department for evaluation of a left thumb laceration that occurred 1 hour ago. He reports that he cut his left thumb on a metal can. He endorses associated mild, throbbing pain in his left thumb. Pt states he immediately rinsed the area with water and soap. The bleeding is currently controlled with applied pressure and guaze to the thumb. He reports his last tetanus was 1 year ago. Pt states he is able to move the thumb without difficulty. He denies any numbness, sensation loss or other associated symptoms.  Past Medical History  Diagnosis Date  . Hypertension   . Chicken pox   . Allergy   . Kidney stones    Past Surgical History  Procedure Laterality Date  . Knee surgery     Family History  Problem Relation Age of Onset  . Heart attack Mother   . Ovarian cancer Mother   . Arthritis Mother   . Leukemia Mother   . Heart attack Father   . Heart attack Maternal Grandmother   . Arthritis Maternal Grandmother   . Diabetes Maternal Grandmother   . Hypertension Maternal Grandmother   . Stroke Paternal Grandmother    Social History  Substance Use Topics  . Smoking status: Never Smoker   . Smokeless tobacco: Current User    Types: Chew  . Alcohol Use: Yes     Comment: Rarely    Review of Systems  All other systems reviewed and are negative.     Allergies  Penicillins  Home Medications   Prior to Admission medications   Medication Sig Start Date End Date  Taking? Authorizing Provider  cyclobenzaprine (FLEXERIL) 10 MG tablet Take 1 tablet (10 mg total) by mouth 2 (two) times daily as needed for muscle spasms. 12/21/14   Elpidio AnisShari Upstill, PA-C  ibuprofen (ADVIL,MOTRIN) 800 MG tablet Take 1 tablet (800 mg total) by mouth 3 (three) times daily. 02/01/15   Veryl SpeakGregory D Calone, FNP  venlafaxine XR (EFFEXOR-XR) 150 MG 24 hr capsule Take 1 capsule (150 mg total) by mouth daily with breakfast. 06/27/15   Veryl SpeakGregory D Calone, FNP   Triage Vitals: BP 106/78 mmHg  Pulse 99  Temp(Src) 98.2 F (36.8 C)  Resp 16  Ht 6' (1.829 m)  Wt 245 lb (111.131 kg)  BMI 33.22 kg/m2  SpO2 100%  Physical Exam  Constitutional: He is oriented to person, place, and time. He appears well-developed and well-nourished. No distress.  HENT:  Head: Normocephalic and atraumatic.  Eyes: Conjunctivae and EOM are normal.  Neck: Neck supple. No tracheal deviation present.  Cardiovascular: Normal rate, regular rhythm and normal heart sounds.   Pulmonary/Chest: Effort normal and breath sounds normal. No respiratory distress.  Musculoskeletal: Normal range of motion.  Neurological: He is alert and oriented to person, place, and time.  Skin: Skin is warm and dry.  Oblique laceration to distal aspect of the left thumb pad. No bone or tendon involvement. Full active range of  motion. Distal pulses intact, brisk cap refill.  Psychiatric: He has a normal mood and affect. His behavior is normal.  Nursing note and vitals reviewed.   ED Course  Procedures (including critical care time)  DIAGNOSTIC STUDIES: Oxygen Saturation is 100% on RA, normal by my interpretation.    COORDINATION OF CARE:  10:24 PM  LACERATION REPAIR PROCEDURE NOTE The patient's identification was confirmed and consent was obtained. This procedure was performed by Joycie Peek, PA-C at 10:24 PM. Site: Left thumb Sterile procedures observed this Anesthetic used (type and amt): Lidocaine 1% without Suture type/size:  4-0 Prolene Length: 2 cm # of Sutures: 4 Technique: Simple interrupted Complexity complex Antibx ointment applied yes, bacitracin Tetanus UTD or ordered, tetanus up-to-date Site anesthetized, irrigated with NS, explored without evidence of foreign body, wound well approximated, site covered with dry, sterile dressing.  Patient tolerated procedure well without complications. Instructions for care discussed verbally and patient provided with additional written instructions for homecare and f/u.   NERVE BLOCK Performed by: Sharlene Motts Consent: Verbal consent obtained. Required items: required blood products, implants, devices, and special equipment available Time out: Immediately prior to procedure a "time out" was called to verify the correct patient, procedure, equipment, support staff and site/side marked as required.  Indication: Pain  Nerve block body site: Left thumb   Preparation: Patient was prepped and draped in the usual sterile fashion. Needle gauge: 24 G Location technique: anatomical landmarks  Local anesthetic: Lidocaine   Anesthetic total: 4 ml  Outcome: pain improved Patient tolerance: Patient tolerated the procedure well with no immediate complications.   Labs Review Labs Reviewed - No data to display  Imaging Review No results found.    EKG Interpretation None     Meds given in ED:  Medications  bacitracin ointment 1 application (not administered)  sodium bicarbonate (NEUT) 4 % injection 5 mL (5 mLs Subcutaneous Given 11/10/15 2053)  lidocaine (PF) (XYLOCAINE) 1 % injection 5 mL (5 mLs Infiltration Given 11/10/15 2053)    Discharge Medication List as of 11/10/2015  9:46 PM     Filed Vitals:   11/10/15 1937  BP: 106/78  Pulse: 99  Temp: 98.2 F (36.8 C)  Resp: 16  Height: 6' (1.829 m)  Weight: 111.131 kg  SpO2: 100%    MDM  Tdap up-to-date.Pressure irrigation performed. Laceration occurred < 8 hours prior to repair which was well  tolerated. Pt has no co morbidities to effect normal wound healing. Discussed suture home care w pt and answered questions. Pt to f-u for wound check in 3 days and suture removal in 10 days. Pt is hemodynamically stable w no complaints prior to dc.   Overall appears well and appropriate for discharge.  Final diagnoses:  Laceration    I personally performed the services described in this documentation, which was scribed in my presence. The recorded information has been reviewed and is accurate.     Joycie Peek, PA-C 11/10/15 2225  Arby Barrette, MD 11/11/15 [phone]

## 2015-11-10 NOTE — ED Notes (Signed)
Pt c/o laceration to left thumb by metal can x 20 mins ago

## 2015-11-10 NOTE — Discharge Instructions (Signed)
Please keep your laceration clean and dry. Follow-up with your doctor in the next 3 or 4 days for a wound recheck. Return to ED in 10 days for suture removal. Return sooner for any new or worsening symptoms as we discussed.

## 2015-11-10 NOTE — ED Notes (Signed)
PA at bedside to suture thumb

## 2015-11-20 ENCOUNTER — Emergency Department (HOSPITAL_BASED_OUTPATIENT_CLINIC_OR_DEPARTMENT_OTHER)
Admission: EM | Admit: 2015-11-20 | Discharge: 2015-11-20 | Disposition: A | Discharge status: Home / Self Care | Payer: No Typology Code available for payment source | Attending: Emergency Medicine | Admitting: Emergency Medicine

## 2015-11-20 ENCOUNTER — Encounter (HOSPITAL_BASED_OUTPATIENT_CLINIC_OR_DEPARTMENT_OTHER): Payer: Self-pay | Admitting: Emergency Medicine

## 2015-11-20 DIAGNOSIS — Z880 Allergy status to penicillin: Secondary | ICD-10-CM | POA: Insufficient documentation

## 2015-11-20 DIAGNOSIS — Z791 Long term (current) use of non-steroidal anti-inflammatories (NSAID): Secondary | ICD-10-CM | POA: Insufficient documentation

## 2015-11-20 DIAGNOSIS — Z4802 Encounter for removal of sutures: Secondary | ICD-10-CM | POA: Insufficient documentation

## 2015-11-20 DIAGNOSIS — Z87442 Personal history of urinary calculi: Secondary | ICD-10-CM | POA: Insufficient documentation

## 2015-11-20 DIAGNOSIS — I10 Essential (primary) hypertension: Secondary | ICD-10-CM | POA: Insufficient documentation

## 2015-11-20 DIAGNOSIS — Z79899 Other long term (current) drug therapy: Secondary | ICD-10-CM | POA: Insufficient documentation

## 2015-11-20 DIAGNOSIS — Z8619 Personal history of other infectious and parasitic diseases: Secondary | ICD-10-CM | POA: Insufficient documentation

## 2015-11-20 NOTE — ED Notes (Signed)
Patient here to have stiches to come out

## 2015-11-20 NOTE — ED Provider Notes (Signed)
CSN: [id number]     Arrival date & time 11/20/15  1710 History  By signing my name below, I, Placido Sou, attest that this documentation has been prepared under the direction and in the presence of Glynn Octave, MD. Electronically Signed: Placido Sou, ED Scribe. 11/20/2015. 5:48 PM.    Chief Complaint  Patient presents with  . Suture / Staple Removal   The history is provided by the patient. No language interpreter was used.    HPI Comments: Joel [name] is a 45 y.o. male who presents to the Emergency Department for removal of 4 sutures to his left thumb that were put in place on 1/5. He notes initially cutting the thumb on the edge of a can and confirms his TDAP is UTD. He denies a hx of DM and confirms his hx of HTN and denies an recent issues. He denies any drainage, fevers, chills, numbness, weakness, or other associated symptoms at this time.   Past Medical History  Diagnosis Date  . Hypertension   . Chicken pox   . Allergy   . Kidney stones    Past Surgical History  Procedure Laterality Date  . Knee surgery     Family History  Problem Relation Age of Onset  . Heart attack Mother   . Ovarian cancer Mother   . Arthritis Mother   . Leukemia Mother   . Heart attack Father   . Heart attack Maternal Grandmother   . Arthritis Maternal Grandmother   . Diabetes Maternal Grandmother   . Hypertension Maternal Grandmother   . Stroke Paternal Grandmother    Social History  Substance Use Topics  . Smoking status: Never Smoker   . Smokeless tobacco: Current User    Types: Chew  . Alcohol Use: Yes     Comment: Rarely    Review of Systems A complete 10 system review of systems was obtained and all systems are negative except as noted in the HPI and PMH.   Allergies  Penicillins  Home Medications   Prior to Admission medications   Medication Sig Start Date End Date Taking? Authorizing Provider  cyclobenzaprine (FLEXERIL) 10 MG tablet Take 1 tablet (10 mg  total) by mouth 2 (two) times daily as needed for muscle spasms. 12/21/14   Elpidio Anis, PA-C  ibuprofen (ADVIL,MOTRIN) 800 MG tablet Take 1 tablet (800 mg total) by mouth 3 (three) times daily. 02/01/15   Veryl Speak, FNP  venlafaxine XR (EFFEXOR-XR) 150 MG 24 hr capsule Take 1 capsule (150 mg total) by mouth daily with breakfast. 06/27/15   Veryl Speak, FNP   BP 129/82 mmHg  Pulse 100  Temp(Src) 98.5 F (36.9 C) (Oral)  Resp 18  SpO2 99%    Physical Exam  Constitutional: He is oriented to person, place, and time. He appears well-developed and well-nourished. No distress.  HENT:  Head: Normocephalic and atraumatic.  Mouth/Throat: Oropharynx is clear and moist. No oropharyngeal exudate.  Eyes: Conjunctivae and EOM are normal. Pupils are equal, round, and reactive to light.  Neck: Normal range of motion. Neck supple.  No meningismus.  Cardiovascular: Normal rate, regular rhythm, normal heart sounds and intact distal pulses.   No murmur heard. Pulmonary/Chest: Effort normal and breath sounds normal. No respiratory distress.  Abdominal: Soft. There is no tenderness. There is no rebound and no guarding.  Musculoskeletal: Normal range of motion. He exhibits no edema or tenderness.  Neurological: He is alert and oriented to person, place, and time. No cranial  nerve deficit. He exhibits normal muscle tone. Coordination normal.  No ataxia on finger to nose bilaterally. No pronator drift. 5/5 strength throughout. CN 2-12 intact.Equal grip strength. Sensation intact.   Skin: Skin is warm.  Sutures intact to left thumb; healing well; no cellulitis; no drainage; ROM intact; small area of callused skin medially  Psychiatric: He has a normal mood and affect. His behavior is normal.  Nursing note and vitals reviewed.   ED Course  Procedures  DIAGNOSTIC STUDIES: Oxygen Saturation is 99% on RA, normal by my interpretation.    COORDINATION OF CARE: 5:37 PM Discussed next steps with pt  and he agreed to the plan.   Labs Review Labs Reviewed - No data to display  Imaging Review No results found.   EKG Interpretation None      MDM   Final diagnoses:  Visit for suture removal   Suture removal to left thumb. Healing well. No bleeding or drainage.  Sutures removed by nursing staff. No evidence of infection.  I personally performed the services described in this documentation, which was scribed in my presence. The recorded information has been reviewed and is accurate.    Glynn OctaveStephen Anae Hams, MD 11/20/15 [phone]

## 2015-11-20 NOTE — Discharge Instructions (Signed)

## 2018-08-26 ENCOUNTER — Telehealth: Payer: Self-pay

## 2018-08-26 NOTE — Telephone Encounter (Signed)
Called to verify appointment tomorrow, no answer and no voicemail set up.

## 2018-08-27 ENCOUNTER — Ambulatory Visit (HOSPITAL_COMMUNITY)
Admission: RE | Admit: 2018-08-27 | Discharge: 2018-08-27 | Disposition: A | Discharge status: Home / Self Care | Payer: No Typology Code available for payment source | Source: Ambulatory Visit | Attending: Family Medicine | Admitting: Family Medicine

## 2018-08-27 ENCOUNTER — Ambulatory Visit (INDEPENDENT_AMBULATORY_CARE_PROVIDER_SITE_OTHER): Payer: Self-pay | Admitting: Family Medicine

## 2018-08-27 ENCOUNTER — Encounter: Payer: Self-pay | Admitting: Family Medicine

## 2018-08-27 VITALS — BP 111/65 | HR 84 | Temp 98.6°F | Resp 16 | Ht 69.5 in | Wt 256.0 lb

## 2018-08-27 DIAGNOSIS — I8391 Asymptomatic varicose veins of right lower extremity: Secondary | ICD-10-CM | POA: Diagnosis not present

## 2018-08-27 DIAGNOSIS — M25561 Pain in right knee: Secondary | ICD-10-CM

## 2018-08-27 DIAGNOSIS — Z23 Encounter for immunization: Secondary | ICD-10-CM

## 2018-08-27 DIAGNOSIS — M25551 Pain in right hip: Secondary | ICD-10-CM

## 2018-08-27 DIAGNOSIS — G8929 Other chronic pain: Secondary | ICD-10-CM

## 2018-08-27 DIAGNOSIS — M1711 Unilateral primary osteoarthritis, right knee: Secondary | ICD-10-CM | POA: Diagnosis not present

## 2018-08-27 DIAGNOSIS — Z0000 Encounter for general adult medical examination without abnormal findings: Secondary | ICD-10-CM

## 2018-08-27 DIAGNOSIS — I10 Essential (primary) hypertension: Secondary | ICD-10-CM

## 2018-08-27 DIAGNOSIS — M1611 Unilateral primary osteoarthritis, right hip: Secondary | ICD-10-CM | POA: Diagnosis not present

## 2018-08-27 DIAGNOSIS — Z114 Encounter for screening for human immunodeficiency virus [HIV]: Secondary | ICD-10-CM

## 2018-08-27 MED ORDER — IBUPROFEN 800 MG PO TABS: 800.0000 mg | ORAL_TABLET | Freq: Three times a day (TID) | ORAL | 0 refills | Status: DC | PRN

## 2018-08-27 NOTE — Patient Instructions (Addendum)
I sent ibuprofen 800mg  to the pharmacy for you.      Hip Pain The hip is the joint between the upper legs and the lower pelvis. The bones, cartilage, tendons, and muscles of your hip joint support your body and allow you to move around. Hip pain can range from a minor ache to severe pain in one or both of your hips. The pain may be felt on the inside of the hip joint near the groin, or the outside near the buttocks and upper thigh. You may also have swelling or stiffness. Follow these instructions at home: Managing pain, stiffness, and swelling  If directed, apply ice to the injured area. ? Put ice in a plastic bag. ? Place a towel between your skin and the bag. ? Leave the ice on for 20 minutes, 2-3 times a day  Sleep with a pillow between your legs on your most comfortable side.  Avoid any activities that cause pain. General instructions  Take over-the-counter and prescription medicines only as told by your health care provider.  Do any exercises as told by your health care provider.  Record the following: ? How often you have hip pain. ? The location of your pain. ? What the pain feels like. ? What makes the pain worse.  Keep all follow-up visits as told by your health care provider. This is important. Contact a health care provider if:  You cannot put weight on your leg.  Your pain or swelling continues or gets worse after one week.  It gets harder to walk.  You have a fever. Get help right away if:  You fall.  You have a sudden increase in pain and swelling in your hip.  Your hip is red or swollen or very tender to touch. Summary  Hip pain can range from a minor ache to severe pain in one or both of your hips.  The pain may be felt on the inside of the hip joint near the groin, or the outside near the buttocks and upper thigh.  Avoid any activities that cause pain.  Record how often you have hip pain, the location of the pain, what makes it worse and what  it feels like. This information is not intended to replace advice given to you by your health care provider. Make sure you discuss any questions you have with your health care provider. Document Released: 04/11/2010 Document Revised: 09/24/2016 Document Reviewed: 09/24/2016 Elsevier Interactive Patient Education  [employer].

## 2018-08-27 NOTE — Progress Notes (Signed)
[facility]  New Patient Encounter Provider: Mike Gip, FNP    ZOX:[id number]  WJX:[id number]  DOB - [birth year]  SUBJECTIVE:   Joel [name], is a 47 y.o. male who presents to establish care with this clinic.   Current problems/concerns:  Patient presents to establish care with this clinic today.  He has not been lost to care for over the past 2 years due to lack of insurance.  Patient reports a history of bilateral hip and knee pain.  States that he had several orthopedic conditions as a child.  He states that he is not currently taking medication for the pain.  And is requesting a nonnarcotic pain medication today. Patient not fasting today and will return for fasting labs in the morning.  He also reports a history of depression and anxiety.  He is not currently on medication or seeking psychiatric help.  He denies symptoms today.  Denies suicidal homicidal ideations.  Denies auditory or visual hallucinations.  Allergies  Allergen Reactions  . Penicillins Anaphylaxis   Past Medical History:  Diagnosis Date  . Allergy   . Chicken pox   . Hypertension   . Kidney stones    Current Outpatient Medications on File Prior to Visit  Medication Sig Dispense Refill  . cetirizine (ZYRTEC ALLERGY) 10 MG tablet Take 10 mg by mouth daily.     No current facility-administered medications on file prior to visit.    Family History  Problem Relation Age of Onset  . Heart attack Mother   . Ovarian cancer Mother   . Arthritis Mother   . Leukemia Mother   . Heart attack Father   . Heart attack Maternal Grandmother   . Arthritis Maternal Grandmother   . Diabetes Maternal Grandmother   . Hypertension Maternal Grandmother   . Stroke Paternal Grandmother    Social History   Socioeconomic History  . Marital status: Single    Spouse name: Not on file  . Number of children: 0  . Years of education: 36  . Highest education level: Not on  file  Occupational History  . Occupation: Security  Social Needs  . Financial resource strain: Not on file  . Food insecurity:    Worry: Not on file    Inability: Not on file  . Transportation needs:    Medical: Not on file    Non-medical: Not on file  Tobacco Use  . Smoking status: Never Smoker  . Smokeless tobacco: Current User    Types: Chew  Substance and Sexual Activity  . Alcohol use: Yes    Comment: Rarely  . Drug use: No  . Sexual activity: Not on file  Lifestyle  . Physical activity:    Days per week: Not on file    Minutes per session: Not on file  . Stress: Not on file  Relationships  . Social connections:    Talks on phone: Not on file    Gets together: Not on file    Attends religious service: Not on file    Active member of club or organization: Not on file    Attends meetings of clubs or organizations: Not on file    Relationship status: Not on file  . Intimate partner violence:    Fear of current or ex partner: Not on file    Emotionally abused: Not on file    Physically abused: Not on file    Forced sexual activity: Not on file  Other Topics Concern  . Not on file  Social History Narrative   Born [place], [place] and raised in [place], [place]. Currently resides in an apartment. 2 dogs. Fun: Shoot   Denies religious beliefs that would effect health care.     Review of Systems  Constitutional: Negative.   HENT: Negative.   Eyes: Negative.   Respiratory: Negative.   Cardiovascular: Negative.   Gastrointestinal: Negative.   Genitourinary: Negative.   Musculoskeletal: Positive for joint pain and myalgias.  Skin: Negative.   Neurological: Negative.   Psychiatric/Behavioral: Negative.      OBJECTIVE:    BP 111/65 (BP Location: Left Arm, Patient Position: Sitting, Cuff Size: Large)   Pulse 84   Temp 98.6 F (37 C) (Oral)   Resp 16   Ht 5' 9.5" (1.765 m)   Wt 256 lb (116.1 kg)   SpO2 98%   BMI 37.26 kg/m   Physical Exam  Constitutional: He  is oriented to person, place, and time and well-developed, well-nourished, and in no distress. No distress.  HENT:  Head: Normocephalic and atraumatic.  Eyes: Pupils are equal, round, and reactive to light. Conjunctivae and EOM are normal.  Neck: Normal range of motion. Neck supple.  Cardiovascular: Normal rate, regular rhythm and intact distal pulses. Exam reveals no gallop and no friction rub.  No murmur heard. Pulmonary/Chest: Effort normal and breath sounds normal. No respiratory distress. He has no wheezes.  Abdominal: Soft. Bowel sounds are normal. There is no tenderness.  Musculoskeletal: Normal range of motion. He exhibits no edema or tenderness.  Patient expresses pain with movement of bilateral hips.  He has a negative straight leg raise bilaterally.  Full range of motion of bilateral hips and lumbar spine.  Lymphadenopathy:    He has no cervical adenopathy.  Neurological: He is alert and oriented to person, place, and time. Gait normal.  Skin: Skin is warm and dry.  Psychiatric: Mood, memory, affect and judgment normal.  Nursing note and vitals reviewed.    ASSESSMENT/PLAN:  1. Essential hypertension Patient with normal blood pressure reading today in the office.  He is continue to monitor his blood pressure at home.  No medications warranted at the present time.  Will continue to monitor. - CBC With Differential; Future - Comprehensive metabolic panel; Future  2. Routine general medical examination at a health care facility Labs ordered and pending - CBC With Differential; Future - Comprehensive metabolic panel; Future - Lipid Panel With LDL/HDL Ratio; Future - TSH; Future  3. Right hip pain - DG HIPS BILAT WITH PELVIS 2V; Future  4. Chronic pain of right knee Imaging ordered.  Will refer to Ortho or physical therapy pending image results - DG Knee Complete 4 Views Right; Future  5. Screening for HIV (human immunodeficiency virus) Health maintenance - HIV  antibody (with reflex); Future    The patient was given clear instructions to go to ER or return to medical center if symptoms don't improve, worsen or new problems develop. The patient verbalized understanding. The patient was told to call to get lab results if they haven't heard anything in the next week.     This note has been created with Education officer, environmental. Any transcriptional errors are unintentional.   Ms. Andr L. Riley Lam, FNP-BC [facility] [address] [place], [place] [address] [phone]

## 2018-08-28 LAB — COMPREHENSIVE METABOLIC PANEL
ALT: 20 IU/L (ref 0–44)
AST: 20 IU/L (ref 0–40)
Albumin/Globulin Ratio: 1.1 — ABNORMAL LOW (ref 1.2–2.2)
Albumin: 4.2 g/dL (ref 3.5–5.5)
Alkaline Phosphatase: 84 IU/L (ref 39–117)
BUN/Creatinine Ratio: 11 (ref 9–20)
BUN: 13 mg/dL (ref 6–24)
Bilirubin Total: 0.6 mg/dL (ref 0.0–1.2)
CO2: 24 mmol/L (ref 20–29)
Calcium: 9.2 mg/dL (ref 8.7–10.2)
Chloride: 98 mmol/L (ref 96–106)
Creatinine, Ser: 1.14 mg/dL (ref 0.76–1.27)
GFR calc Af Amer: 87 mL/min/{1.73_m2} (ref >59)
GFR calc non Af Amer: 76 mL/min/{1.73_m2} (ref >59)
Globulin, Total: 4 g/dL (ref 1.5–4.5)
Glucose: 96 mg/dL (ref 65–99)
Potassium: 4 mmol/L (ref 3.5–5.2)
Sodium: 139 mmol/L (ref 134–144)
Total Protein: 8.2 g/dL (ref 6.0–8.5)

## 2018-08-28 LAB — CBC WITH DIFFERENTIAL
Basophils Absolute: 0.1 10*3/uL (ref 0.0–0.2)
Basos: 1 %
EOS (ABSOLUTE): 0.4 10*3/uL (ref 0.0–0.4)
Eos: 4 %
Hematocrit: 43.7 % (ref 37.5–51.0)
Hemoglobin: 14.8 g/dL (ref 13.0–17.7)
Immature Grans (Abs): 0 10*3/uL (ref 0.0–0.1)
Immature Granulocytes: 0 %
Lymphocytes Absolute: 1.7 10*3/uL (ref 0.7–3.1)
Lymphs: 19 %
MCH: 31.8 pg (ref 26.6–33.0)
MCHC: 33.9 g/dL (ref 31.5–35.7)
MCV: 94 fL (ref 79–97)
Monocytes Absolute: 1 10*3/uL — ABNORMAL HIGH (ref 0.1–0.9)
Monocytes: 11 %
Neutrophils Absolute: 5.7 10*3/uL (ref 1.4–7.0)
Neutrophils: 65 %
RBC: 4.65 x10E6/uL (ref 4.14–5.80)
RDW: 12.2 % — ABNORMAL LOW (ref 12.3–15.4)
WBC: 8.8 10*3/uL (ref 3.4–10.8)

## 2018-08-28 LAB — LIPID PANEL WITH LDL/HDL RATIO
Cholesterol, Total: 170 mg/dL (ref 100–199)
HDL: 34 mg/dL — ABNORMAL LOW (ref >39)
LDL Calculated: 113 mg/dL — ABNORMAL HIGH (ref 0–99)
LDl/HDL Ratio: 3.3 ratio (ref 0.0–3.6)
Triglycerides: 114 mg/dL (ref 0–149)
VLDL Cholesterol Cal: 23 mg/dL (ref 5–40)

## 2018-08-28 LAB — HIV ANTIBODY (ROUTINE TESTING W REFLEX): HIV Screen 4th Generation wRfx: NONREACTIVE

## 2018-08-28 LAB — TSH: TSH: 3.99 u[IU]/mL (ref 0.450–4.500)

## 2018-08-29 ENCOUNTER — Telehealth: Payer: Self-pay

## 2018-08-29 NOTE — Telephone Encounter (Signed)
Called, no answer and no voicemail. Thanks!  

## 2018-08-29 NOTE — Telephone Encounter (Signed)
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

## 2018-09-15 ENCOUNTER — Encounter: Payer: Self-pay | Admitting: Family Medicine

## 2018-09-29 ENCOUNTER — Other Ambulatory Visit: Payer: Self-pay

## 2018-09-29 DIAGNOSIS — M25551 Pain in right hip: Secondary | ICD-10-CM

## 2018-09-29 DIAGNOSIS — G8929 Other chronic pain: Secondary | ICD-10-CM

## 2018-09-29 DIAGNOSIS — M25561 Pain in right knee: Secondary | ICD-10-CM

## 2018-09-29 MED ORDER — IBUPROFEN 800 MG PO TABS: 800.0000 mg | ORAL_TABLET | Freq: Three times a day (TID) | ORAL | 0 refills | Status: DC | PRN

## 2018-11-06 ENCOUNTER — Ambulatory Visit: Payer: Self-pay | Admitting: Family Medicine

## 2018-11-10 ENCOUNTER — Other Ambulatory Visit: Payer: Self-pay

## 2018-11-10 DIAGNOSIS — G8929 Other chronic pain: Secondary | ICD-10-CM

## 2018-11-10 DIAGNOSIS — M25561 Pain in right knee: Secondary | ICD-10-CM

## 2018-11-10 DIAGNOSIS — M25551 Pain in right hip: Secondary | ICD-10-CM

## 2018-11-10 MED ORDER — IBUPROFEN 800 MG PO TABS: 800.0000 mg | ORAL_TABLET | Freq: Three times a day (TID) | ORAL | 0 refills | Status: DC | PRN

## 2018-11-27 ENCOUNTER — Ambulatory Visit: Payer: Self-pay | Admitting: Family Medicine

## 2018-12-04 ENCOUNTER — Ambulatory Visit (INDEPENDENT_AMBULATORY_CARE_PROVIDER_SITE_OTHER): Payer: Self-pay | Admitting: Family Medicine

## 2018-12-04 ENCOUNTER — Encounter: Payer: Self-pay | Admitting: Family Medicine

## 2018-12-04 VITALS — BP 120/78 | HR 87 | Temp 98.6°F | Resp 16 | Ht 69.5 in | Wt 259.0 lb

## 2018-12-04 DIAGNOSIS — M25561 Pain in right knee: Secondary | ICD-10-CM

## 2018-12-04 DIAGNOSIS — I10 Essential (primary) hypertension: Secondary | ICD-10-CM

## 2018-12-04 DIAGNOSIS — G8929 Other chronic pain: Secondary | ICD-10-CM

## 2018-12-04 DIAGNOSIS — M25551 Pain in right hip: Secondary | ICD-10-CM

## 2018-12-04 DIAGNOSIS — J301 Allergic rhinitis due to pollen: Secondary | ICD-10-CM

## 2018-12-04 LAB — POCT URINALYSIS DIPSTICK
Bilirubin, UA: NEGATIVE
Blood, UA: NEGATIVE
Glucose, UA: NEGATIVE
Ketones, UA: NEGATIVE
Leukocytes, UA: NEGATIVE
Nitrite, UA: NEGATIVE
Protein, UA: POSITIVE — AB
Spec Grav, UA: 1.025 (ref 1.010–1.025)
Urobilinogen, UA: 1 E.U./dL
pH, UA: 5.5 (ref 5.0–8.0)

## 2018-12-04 MED ORDER — CETIRIZINE HCL 10 MG PO TABS: 10.0000 mg | ORAL_TABLET | Freq: Every day | ORAL | 5 refills | Status: DC

## 2018-12-04 MED ORDER — IBUPROFEN 800 MG PO TABS: 800.0000 mg | ORAL_TABLET | Freq: Three times a day (TID) | ORAL | 0 refills | Status: DC | PRN

## 2018-12-04 NOTE — Progress Notes (Signed)
[facility]   Progress Note: General Provider: Mike Gip, FNP  SUBJECTIVE:   Joel [name] is a 48 y.o. male who  has a past medical history of Allergy, Chicken pox, Hypertension, and Kidney stones.. Patient presents today for Hypertension; Hip Pain (right hip ); and Knee Pain (right knee ) Patient presents for follow up. He states that he was on BP medication in the past. Has not taken in several years. Patient states that he is doing well except for chronic knee and hip pain. He does not want any opioids or similar medications due to fear of addiction. 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   Review of Systems  Constitutional: Negative.   HENT: Negative.   Eyes: Negative.   Respiratory: Negative.   Cardiovascular: Negative.   Gastrointestinal: Negative.   Genitourinary: Negative.   Musculoskeletal: Positive for joint pain.  Skin: Negative.   Neurological: Negative.   Psychiatric/Behavioral: Negative.      OBJECTIVE: BP 120/78 (BP Location: Left Arm, Patient Position: Sitting, Cuff Size: Large)   Pulse 87   Temp 98.6 F (37 C) (Oral)   Resp 16   Ht 5' 9.5" (1.765 m)   Wt 259 lb (117.5 kg)   SpO2 99%   BMI 37.70 kg/m   Wt Readings from Last 3 Encounters:  12/04/18 259 lb (117.5 kg)  08/27/18 256 lb (116.1 kg)  11/10/15 245 lb (111.1 kg)     Physical Exam Vitals signs and nursing note reviewed.  Constitutional:      General: He is not in acute distress.    Appearance: He is well-developed.  HENT:     Head: Normocephalic and atraumatic.  Eyes:     Conjunctiva/sclera: Conjunctivae normal.     Pupils: Pupils are equal, round, and reactive to light.  Neck:     Musculoskeletal: Normal range of motion.  Cardiovascular:     Rate and Rhythm: Normal rate and regular rhythm.     Heart sounds: Normal heart sounds.  Pulmonary:     Effort: Pulmonary effort is normal. No  respiratory distress.     Breath sounds: Normal breath sounds.  Abdominal:     General: Bowel sounds are normal. There is no distension.     Palpations: Abdomen is soft.  Musculoskeletal: Normal range of motion.  Skin:    General: Skin is warm and dry.  Neurological:     Mental Status: He is alert and oriented to person, place, and time.  Psychiatric:        Mood and Affect: Mood normal.        Behavior: Behavior normal.        Thought Content: Thought content normal.        Judgment: Judgment normal.     ASSESSMENT/PLAN:   1. Essential hypertension Continue to use diet and exercise for management.  - Urinalysis Dipstick  2. Right hip pain - ibuprofen (ADVIL,MOTRIN) 800 MG tablet; Take 1 tablet (800 mg total) by mouth every 8 (eight) hours as needed.  Dispense: 30 tablet; Refill: 0  3. Chronic pain of right knee - ibuprofen (ADVIL,MOTRIN) 800 MG tablet; Take 1 tablet (800 mg total) by mouth every 8 (eight) hours as needed.  Dispense: 30 tablet; Refill: 0  Patient to fill out application for [facility] discount and orange card so that he can have physical therapy or specialist.   Return in about 3 months (around 03/05/2019)  for HTN and Physical.    The patient was given clear instructions to go to ER or return to medical center if symptoms do not improve, worsen or new problems develop. The patient verbalized understanding and agreed with plan of care.   Ms. Freda Jackson. Riley Lam, FNP-BC [facility] [address] [place], [place] [address] [phone]

## 2018-12-04 NOTE — Patient Instructions (Signed)
Managing Your Hypertension Hypertension is commonly called high blood pressure. This is when the force of your blood pressing against the walls of your arteries is too strong. Arteries are blood vessels that carry blood from your heart throughout your body. Hypertension forces the heart to work harder to pump blood, and may cause the arteries to become narrow or stiff. Having untreated or uncontrolled hypertension can cause heart attack, stroke, kidney disease, and other problems. What are blood pressure readings? A blood pressure reading consists of a higher number over a lower number. Ideally, your blood pressure should be below 120/80. The first ("top") number is called the systolic pressure. It is a measure of the pressure in your arteries as your heart beats. The second ("bottom") number is called the diastolic pressure. It is a measure of the pressure in your arteries as the heart relaxes. What does my blood pressure reading mean? Blood pressure is classified into four stages. Based on your blood pressure reading, your health care provider may use the following stages to determine what type of treatment you need, if any. Systolic pressure and diastolic pressure are measured in a unit called mm Hg. Normal  Systolic pressure: below 120.  Diastolic pressure: below 80. Elevated  Systolic pressure: 120-129.  Diastolic pressure: below 80. Hypertension stage 1  Systolic pressure: 130-139.  Diastolic pressure: 80-89. Hypertension stage 2  Systolic pressure: 140 or above.  Diastolic pressure: 90 or above. What health risks are associated with hypertension? Managing your hypertension is an important responsibility. Uncontrolled hypertension can lead to:  A heart attack.  A stroke.  A weakened blood vessel (aneurysm).  Heart failure.  Kidney damage.  Eye damage.  Metabolic syndrome.  Memory and concentration problems. What changes can I make to manage my  hypertension? Hypertension can be managed by making lifestyle changes and possibly by taking medicines. Your health care provider will help you make a plan to bring your blood pressure within a normal range. Eating and drinking   Eat a diet that is high in fiber and potassium, and low in salt (sodium), added sugar, and fat. An example eating plan is called the DASH (Dietary Approaches to Stop Hypertension) diet. To eat this way: ? Eat plenty of fresh fruits and vegetables. Try to fill half of your plate at each meal with fruits and vegetables. ? Eat whole grains, such as whole wheat pasta, brown rice, or whole grain bread. Fill about one quarter of your plate with whole grains. ? Eat low-fat diary products. ? Avoid fatty cuts of meat, processed or cured meats, and poultry with skin. Fill about one quarter of your plate with lean proteins such as fish, chicken without skin, beans, eggs, and tofu. ? Avoid premade and processed foods. These tend to be higher in sodium, added sugar, and fat.  Reduce your daily sodium intake. Most people with hypertension should eat less than 1,500 mg of sodium a day.  Limit alcohol intake to no more than 1 drink a day for nonpregnant women and 2 drinks a day for men. One drink equals 12 oz of beer, 5 oz of wine, or 1 oz of hard liquor. Lifestyle  Work with your health care provider to maintain a healthy body weight, or to lose weight. Ask what an ideal weight is for you.  Get at least 30 minutes of exercise that causes your heart to beat faster (aerobic exercise) most days of the week. Activities may include walking, swimming, or biking.  Include exercise   to strengthen your muscles (resistance exercise), such as weight lifting, as part of your weekly exercise routine. Try to do these types of exercises for 30 minutes at least 3 days a week.  Do not use any products that contain nicotine or tobacco, such as cigarettes and e-cigarettes. If you need help quitting,  ask your health care provider.  Control any long-term (chronic) conditions you have, such as high cholesterol or diabetes. Monitoring  Monitor your blood pressure at home as told by your health care provider. Your personal target blood pressure may vary depending on your medical conditions, your age, and other factors.  Have your blood pressure checked regularly, as often as told by your health care provider. Working with your health care provider  Review all the medicines you take with your health care provider because there may be side effects or interactions.  Talk with your health care provider about your diet, exercise habits, and other lifestyle factors that may be contributing to hypertension.  Visit your health care provider regularly. Your health care provider can help you create and adjust your plan for managing hypertension. Will I need medicine to control my blood pressure? Your health care provider may prescribe medicine if lifestyle changes are not enough to get your blood pressure under control, and if:  Your systolic blood pressure is 130 or higher.  Your diastolic blood pressure is 80 or higher. Take medicines only as told by your health care provider. Follow the directions carefully. Blood pressure medicines must be taken as prescribed. The medicine does not work as well when you skip doses. Skipping doses also puts you at risk for problems. Contact a health care provider if:  You think you are having a reaction to medicines you have taken.  You have repeated (recurrent) headaches.  You feel dizzy.  You have swelling in your ankles.  You have trouble with your vision. Get help right away if:  You develop a severe headache or confusion.  You have unusual weakness or numbness, or you feel faint.  You have severe pain in your chest or abdomen.  You vomit repeatedly.  You have trouble breathing. Summary  Hypertension is when the force of blood pumping  through your arteries is too strong. If this condition is not controlled, it may put you at risk for serious complications.  Your personal target blood pressure may vary depending on your medical conditions, your age, and other factors. For most people, a normal blood pressure is less than 120/80.  Hypertension is managed by lifestyle changes, medicines, or both. Lifestyle changes include weight loss, eating a healthy, low-sodium diet, exercising more, and limiting alcohol. This information is not intended to replace advice given to you by your health care provider. Make sure you discuss any questions you have with your health care provider. Document Released: 07/16/2012 Document Revised: 09/19/2016 Document Reviewed: 09/19/2016 Elsevier Interactive Patient Education  2019 Elsevier Inc[employer] Hip Pain  The hip is the joint between the upper legs and the lower pelvis. The bones, cartilage, tendons, and muscles of your hip joint support your body and allow you to move around. Hip pain can range from a minor ache to severe pain in one or both of your hips. The pain may be felt on the inside of the hip joint near the groin, or the outside near the buttocks and upper thigh. You may also have swelling or stiffness. Follow these instructions at home: Managing pain, stiffness, and swelling  If directed, apply ice  to the injured area. ? Put ice in a plastic bag. ? Place a towel between your skin and the bag. ? Leave the ice on for 20 minutes, 2-3 times a day  Sleep with a pillow between your legs on your most comfortable side.  Avoid any activities that cause pain. General instructions  Take over-the-counter and prescription medicines only as told by your health care provider.  Do any exercises as told by your health care provider.  Record the following: ? How often you have hip pain. ? The location of your pain. ? What the pain feels like. ? What makes the pain worse.  Keep all follow-up visits  as told by your health care provider. This is important. Contact a health care provider if:  You cannot put weight on your leg.  Your pain or swelling continues or gets worse after one week.  It gets harder to walk.  You have a fever. Get help right away if:  You fall.  You have a sudden increase in pain and swelling in your hip.  Your hip is red or swollen or very tender to touch. Summary  Hip pain can range from a minor ache to severe pain in one or both of your hips.  The pain may be felt on the inside of the hip joint near the groin, or the outside near the buttocks and upper thigh.  Avoid any activities that cause pain.  Record how often you have hip pain, the location of the pain, what makes it worse and what it feels like. This information is not intended to replace advice given to you by your health care provider. Make sure you discuss any questions you have with your health care provider. Document Released: 04/11/2010 Document Revised: 09/24/2016 Document Reviewed: 09/24/2016 Elsevier Interactive Patient Education  2019 [employer]

## 2019-03-04 ENCOUNTER — Other Ambulatory Visit: Payer: Self-pay | Admitting: Family Medicine

## 2019-03-04 DIAGNOSIS — G8929 Other chronic pain: Secondary | ICD-10-CM

## 2019-03-04 DIAGNOSIS — M25561 Pain in right knee: Secondary | ICD-10-CM

## 2019-03-04 DIAGNOSIS — M25551 Pain in right hip: Secondary | ICD-10-CM

## 2019-03-05 ENCOUNTER — Ambulatory Visit: Payer: Self-pay | Admitting: Family Medicine

## 2019-04-01 ENCOUNTER — Ambulatory Visit: Payer: Self-pay | Admitting: Family Medicine

## 2019-05-21 ENCOUNTER — Other Ambulatory Visit: Payer: Self-pay | Admitting: Family Medicine

## 2019-05-21 DIAGNOSIS — G8929 Other chronic pain: Secondary | ICD-10-CM

## 2019-05-21 DIAGNOSIS — M25561 Pain in right knee: Secondary | ICD-10-CM

## 2019-05-21 DIAGNOSIS — M25551 Pain in right hip: Secondary | ICD-10-CM

## 2019-06-10 ENCOUNTER — Other Ambulatory Visit: Payer: Self-pay

## 2019-06-10 DIAGNOSIS — Z20822 Contact with and (suspected) exposure to covid-19: Secondary | ICD-10-CM

## 2019-06-12 LAB — NOVEL CORONAVIRUS, NAA: SARS-CoV-2, NAA: NOT DETECTED

## 2019-06-30 NOTE — Progress Notes (Signed)
Not a patient at [facility].

## 2019-07-02 ENCOUNTER — Other Ambulatory Visit: Payer: Self-pay | Admitting: Family Medicine

## 2019-07-02 DIAGNOSIS — J301 Allergic rhinitis due to pollen: Secondary | ICD-10-CM

## 2019-07-15 ENCOUNTER — Encounter (HOSPITAL_COMMUNITY): Payer: Self-pay | Admitting: *Deleted

## 2019-07-15 ENCOUNTER — Encounter (HOSPITAL_COMMUNITY): Payer: Self-pay

## 2019-11-19 ENCOUNTER — Other Ambulatory Visit: Payer: Self-pay | Admitting: Family Medicine

## 2019-11-19 DIAGNOSIS — M25551 Pain in right hip: Secondary | ICD-10-CM

## 2019-11-19 DIAGNOSIS — G8929 Other chronic pain: Secondary | ICD-10-CM

## 2020-02-04 ENCOUNTER — Other Ambulatory Visit: Payer: Self-pay

## 2020-02-04 ENCOUNTER — Ambulatory Visit (INDEPENDENT_AMBULATORY_CARE_PROVIDER_SITE_OTHER): Payer: Self-pay | Admitting: Nurse Practitioner

## 2020-02-04 ENCOUNTER — Encounter: Payer: Self-pay | Admitting: Nurse Practitioner

## 2020-02-04 VITALS — BP 111/65 | HR 83 | Temp 98.0°F | Ht 69.5 in | Wt 257.8 lb

## 2020-02-04 DIAGNOSIS — M25561 Pain in right knee: Secondary | ICD-10-CM

## 2020-02-04 DIAGNOSIS — M25551 Pain in right hip: Secondary | ICD-10-CM

## 2020-02-04 DIAGNOSIS — M25511 Pain in right shoulder: Secondary | ICD-10-CM

## 2020-02-04 DIAGNOSIS — M25512 Pain in left shoulder: Secondary | ICD-10-CM

## 2020-02-04 DIAGNOSIS — J301 Allergic rhinitis due to pollen: Secondary | ICD-10-CM

## 2020-02-04 DIAGNOSIS — G8929 Other chronic pain: Secondary | ICD-10-CM

## 2020-02-04 DIAGNOSIS — I10 Essential (primary) hypertension: Secondary | ICD-10-CM

## 2020-02-04 DIAGNOSIS — Z0000 Encounter for general adult medical examination without abnormal findings: Secondary | ICD-10-CM

## 2020-02-04 LAB — POCT URINALYSIS DIPSTICK
Bilirubin, UA: NEGATIVE
Blood, UA: NEGATIVE
Glucose, UA: NEGATIVE
Ketones, UA: NEGATIVE
Leukocytes, UA: NEGATIVE
Nitrite, UA: NEGATIVE
Protein, UA: NEGATIVE
Spec Grav, UA: >=1.03 — AB (ref 1.010–1.025)
Urobilinogen, UA: 1 E.U./dL
pH, UA: 5.5 (ref 5.0–8.0)

## 2020-02-04 LAB — POCT GLYCOSYLATED HEMOGLOBIN (HGB A1C): Hemoglobin A1C: 5.1 % (ref 4.0–5.6)

## 2020-02-04 MED ORDER — CETIRIZINE HCL 10 MG PO TABS: 10.0000 mg | ORAL_TABLET | Freq: Every day | ORAL | 1 refills | Status: DC

## 2020-02-04 MED ORDER — IBUPROFEN 800 MG PO TABS: 800.0000 mg | ORAL_TABLET | Freq: Three times a day (TID) | ORAL | 1 refills | Status: AC | PRN

## 2020-02-04 NOTE — Patient Instructions (Signed)
Arthritis Arthritis means joint pain. It can also mean joint disease. A joint is a place where bones come together. There are more than 100 types of arthritis. What are the causes? This condition may be caused by:  Wear and tear of a joint. This is the most common cause.  A lot of acid in the blood, which leads to pain in the joint (gout).  Pain and swelling (inflammation) in a joint.  Infection of a joint.  Injuries in the joint.  A reaction to medicines (allergy). In some cases, the cause may not be known. What are the signs or symptoms? Symptoms of this condition include:  Redness at a joint.  Swelling at a joint.  Stiffness at a joint.  Warmth coming from the joint.  A fever.  A feeling of being sick. How is this treated? This condition may be treated with:  Treating the cause, if it is known.  Rest.  Raising (elevating) the joint.  Putting cold or hot packs on the joint.  Medicines to treat symptoms and reduce pain and swelling.  Shots of medicines (cortisone) into the joint. You may also be told to make changes in your life, such as doing exercises and losing weight. Follow these instructions at home: Medicines  Take over-the-counter and prescription medicines only as told by your doctor.  Do not take aspirin for pain if your doctor says that you may have gout. Activity  Rest your joint if your doctor tells you to.  Avoid activities that make the pain worse.  Exercise your joint regularly as told by your doctor. Try doing exercises like: ? Swimming. ? Water aerobics. ? Biking. ? Walking. Managing pain, stiffness, and swelling      If told, put ice on the affected area. ? Put ice in a plastic bag. ? Place a towel between your skin and the bag. ? Leave the ice on for 20 minutes, 2-3 times per day.  If your joint is swollen, raise (elevate) it above the level of your heart if told by your doctor.  If your joint feels stiff in the morning,  try taking a warm shower.  If told, put heat on the affected area. Do this as often as told by your doctor. Use the heat source that your doctor recommends, such as a moist heat pack or a heating pad. If you have diabetes, do not apply heat without asking your doctor. To apply heat: ? Place a towel between your skin and the heat source. ? Leave the heat on for 20-30 minutes. ? Remove the heat if your skin turns bright red. This is very important if you are unable to feel pain, heat, or cold. You may have a greater risk of getting burned. General instructions  Do not use any products that contain nicotine or tobacco, such as cigarettes, e-cigarettes, and chewing tobacco. If you need help quitting, ask your doctor.  Keep all follow-up visits as told by your doctor. This is important. Contact a doctor if:  The pain gets worse.  You have a fever. Get help right away if:  You have very bad pain in your joint.  You have swelling in your joint.  Your joint is red.  Many joints become painful and swollen.  You have very bad back pain.  Your leg is very weak.  You cannot control your pee (urine) or poop (stool). Summary  Arthritis means joint pain. It can also mean joint disease. A joint is a place   where bones come together.  The most common cause of this condition is wear and tear of a joint.  Symptoms of this condition include redness, swelling, or stiffness of the joint.  This condition is treated with rest, raising the joint, medicines, and putting cold or hot packs on the joint.  Follow your doctor's instructions about medicines, activity, exercises, and other home care treatments. This information is not intended to replace advice given to you by your health care provider. Make sure you discuss any questions you have with your health care provider. Document Revised: 09/29/2018 Document Reviewed: 09/29/2018 Elsevier Patient Education  2020 Elsevier Inc.  

## 2020-02-04 NOTE — Progress Notes (Signed)
Established Patient Office Visit  Subjective:  Patient ID: Joel [name], male    DOB: 12-06-1970  Age: 49 y.o. MRN: [id number]  CC:  Chief Complaint  Patient presents with  . Follow-up    HTN  . Arthritis    Right knee, right leg, right hip    HPI Joel [name] presents for follow up. He  has a past medical history of Allergy, Chicken pox, Hypertension, and Kidney stones.    Hypertension Patient is here for follow-up of elevated blood pressure. He is not exercising and is not adherent to a low-salt diet. Blood pressure is well controlled at home. Cardiac symptoms: none. Patient denies chest pain, dyspnea, fatigue, irregular heart beat, lower extremity edema and palpitations. Cardiovascular risk factors: male gender, obesity (BMI >= 30 kg/m2), sedentary lifestyle and smoking/ tobacco exposure. Use of agents associated with hypertension: NSAIDS. History of target organ damage: none. He admits that he was diagnosed with HTN as a teenager but is not longer taking any medication.    He also presents with arthralgias that began several years ago. Pain is located in multiple joints. The pain is described as intermittent, aching, crushing, numbing and throbbing.  Associated symptoms include: decreased range of motion. The patient has tried movement and OTC pain medications (IBM and APAP) for pain, with moderate relief. Related to injury: yes. Right shoulder popped with movement. He has pain on his upper arm that goes into the the deltoid. He admits that he had some addiction issues with the percocet IBM and APAP is effective for his pain . He is SP right knee surgery with past revision.   Past Medical History:  Diagnosis Date  . Allergy   . Chicken pox   . Hypertension   . Kidney stones     Past Surgical History:  Procedure Laterality Date  . KNEE SURGERY      Family History  Problem Relation Age of Onset  . Heart attack Mother   . Ovarian cancer Mother   . Arthritis  Mother   . Leukemia Mother   . Heart attack Father   . Heart attack Maternal Grandmother   . Arthritis Maternal Grandmother   . Diabetes Maternal Grandmother   . Hypertension Maternal Grandmother   . Stroke Paternal Grandmother     Social History   Socioeconomic History  . Marital status: Single    Spouse name: Not on file  . Number of children: 0  . Years of education: 79  . Highest education level: Not on file  Occupational History  . Occupation: Security  Tobacco Use  . Smoking status: Never Smoker  . Smokeless tobacco: Current User    Types: Chew  Substance and Sexual Activity  . Alcohol use: Yes    Comment: Rarely  . Drug use: No  . Sexual activity: Not Currently  Other Topics Concern  . Not on file  Social History Narrative   Born [place], [place] and raised in [place], [place]. Currently resides in an apartment. 2 dogs. Fun: Shoot   Denies religious beliefs that would effect health care.    Social Determinants of Health   Financial Resource Strain:   . Difficulty of Paying Living Expenses:   Food Insecurity:   . Worried About Programme researcher, broadcasting/film/video in the Last Year:   . Barista in the Last Year:   Transportation Needs:   . Freight forwarder (Medical):   Marland Kitchen Lack of Transportation (Non-Medical):   Physical  Activity:   . Days of Exercise per Week:   . Minutes of Exercise per Session:   Stress:   . Feeling of Stress :   Social Connections:   . Frequency of Communication with Friends and Family:   . Frequency of Social Gatherings with Friends and Family:   . Attends Religious Services:   . Active Member of Clubs or Organizations:   . Attends Archivist Meetings:   Marland Kitchen Marital Status:   Intimate Partner Violence:   . Fear of Current or Ex-Partner:   . Emotionally Abused:   Marland Kitchen Physically Abused:   . Sexually Abused:     Outpatient Medications Prior to Visit  Medication Sig Dispense Refill  . cetirizine (ZYRTEC) 10 MG tablet TAKE 1 TABLET BY  MOUTH EVERY DAY 90 tablet 1  . ibuprofen (ADVIL) 800 MG tablet TAKE 1 TABLET BY MOUTH EVERY 8 HOURS AS NEEDED 30 tablet 0   No facility-administered medications prior to visit.    Allergies  Allergen Reactions  . Penicillins Anaphylaxis    ROS Review of Systems  All other systems reviewed and are negative.     Objective:    Physical Exam  Constitutional: He is oriented to person, place, and time. He appears well-developed.  Obese   HENT:  Head: Normocephalic and atraumatic.  Cardiovascular: Normal rate, regular rhythm, normal heart sounds and intact distal pulses.  Pulmonary/Chest:  Diminished breath sounds  Abdominal: Soft. Bowel sounds are normal.  Musculoskeletal:     Right shoulder: Tenderness present.     Left shoulder: Bony tenderness present.     Cervical back: Normal range of motion.  Neurological: He is alert and oriented to person, place, and time.  Skin: Skin is warm and dry.  Psychiatric: He has a normal mood and affect. His behavior is normal. Thought content normal.    BP 111/65   Pulse 83   Temp 98 F (36.7 C) (Oral)   Ht 5' 9.5" (1.765 m)   Wt 257 lb 12.8 oz (116.9 kg)   SpO2 99%   BMI 37.52 kg/m  Wt Readings from Last 3 Encounters:  02/04/20 257 lb 12.8 oz (116.9 kg)  12/04/18 259 lb (117.5 kg)  08/27/18 256 lb (116.1 kg)     Health Maintenance Due  Topic Date Due  . COLONOSCOPY  Never done    There are no preventive care reminders to display for this patient.  Lab Results  Component Value Date   TSH 3.990 08/27/2018   Lab Results  Component Value Date   WBC 8.8 08/27/2018   HGB 14.8 08/27/2018   HCT 43.7 08/27/2018   MCV 94 08/27/2018   PLT 293.0 02/10/2015   Lab Results  Component Value Date   NA 139 08/27/2018   K 4.0 08/27/2018   CO2 24 08/27/2018   GLUCOSE 96 08/27/2018   BUN 13 08/27/2018   CREATININE 1.14 08/27/2018   BILITOT 0.6 08/27/2018   ALKPHOS 84 08/27/2018   AST 20 08/27/2018   ALT 20 08/27/2018    PROT 8.2 08/27/2018   ALBUMIN 4.2 08/27/2018   CALCIUM 9.2 08/27/2018   GFR 70.88 02/10/2015   Lab Results  Component Value Date   CHOL 170 08/27/2018   Lab Results  Component Value Date   HDL 34 (L) 08/27/2018   Lab Results  Component Value Date   LDLCALC 113 (H) 08/27/2018   Lab Results  Component Value Date   TRIG 114 08/27/2018   Lab Results  Component Value Date   CHOLHDL 4 02/10/2015   Lab Results  Component Value Date   HGBA1C 5.1 02/04/2020      Assessment & Plan:   Problem List Items Addressed This Visit      Unprioritized   Essential hypertension - Primary   Relevant Orders   Comp. Metabolic Panel (12)    Other Visit Diagnoses    Health care maintenance       Relevant Orders   POCT glycosylated hemoglobin (Hb A1C) (Completed)   POCT urinalysis dipstick (Completed)   PSA   CBC with Differential/Platelet   Comp. Metabolic Panel (12)   Right hip pain       Relevant Medications   ibuprofen (ADVIL) 800 MG tablet   Other Relevant Orders   Sedimentation rate   Chronic pain of right knee       Relevant Medications   ibuprofen (ADVIL) 800 MG tablet   Other Relevant Orders   Sedimentation rate   Acute pain of both shoulders       right shoulder stiffness    Relevant Orders   Sedimentation rate   Non-seasonal allergic rhinitis due to pollen       Relevant Medications   cetirizine (ZYRTEC) 10 MG tablet      Meds ordered this encounter  Medications  . ibuprofen (ADVIL) 800 MG tablet    Sig: Take 1 tablet (800 mg total) by mouth every 8 (eight) hours as needed.    Dispense:  60 tablet    Refill:  1    DX Code Needed  THANKS!.    Order Specific Question:   Supervising Provider    Answer:   Quentin Angst [id number]  . cetirizine (ZYRTEC) 10 MG tablet    Sig: Take 1 tablet (10 mg total) by mouth daily.    Dispense:  90 tablet    Refill:  1    Order Specific Question:   Supervising Provider    Answer:   Quentin Angst [id number]     Follow-up: Return in about 6 months (around 08/05/2020).    Barbette Merino, NP

## 2020-02-05 LAB — CBC WITH DIFFERENTIAL/PLATELET
Basophils Absolute: 0.1 10*3/uL (ref 0.0–0.2)
Basos: 1 %
EOS (ABSOLUTE): 0.4 10*3/uL (ref 0.0–0.4)
Eos: 5 %
Hematocrit: 43.5 % (ref 37.5–51.0)
Hemoglobin: 14.6 g/dL (ref 13.0–17.7)
Immature Grans (Abs): 0 10*3/uL (ref 0.0–0.1)
Immature Granulocytes: 0 %
Lymphocytes Absolute: 1.5 10*3/uL (ref 0.7–3.1)
Lymphs: 20 %
MCH: 31.8 pg (ref 26.6–33.0)
MCHC: 33.6 g/dL (ref 31.5–35.7)
MCV: 95 fL (ref 79–97)
Monocytes Absolute: 0.8 10*3/uL (ref 0.1–0.9)
Monocytes: 11 %
Neutrophils Absolute: 4.8 10*3/uL (ref 1.4–7.0)
Neutrophils: 63 %
Platelets: 247 10*3/uL (ref 150–450)
RBC: 4.59 x10E6/uL (ref 4.14–5.80)
RDW: 12.1 % (ref 11.6–15.4)
WBC: 7.6 10*3/uL (ref 3.4–10.8)

## 2020-02-05 LAB — SEDIMENTATION RATE: Sed Rate: 23 mm/hr (ref 0–30)

## 2020-02-05 LAB — COMP. METABOLIC PANEL (12)
AST: 18 IU/L (ref 0–40)
Albumin/Globulin Ratio: 1.1 — ABNORMAL LOW (ref 1.2–2.2)
Albumin: 4 g/dL (ref 4.0–5.0)
Alkaline Phosphatase: 97 IU/L (ref 39–117)
BUN/Creatinine Ratio: 17 (ref 9–20)
BUN: 17 mg/dL (ref 6–24)
Bilirubin Total: 0.4 mg/dL (ref 0.0–1.2)
Calcium: 9.3 mg/dL (ref 8.7–10.2)
Chloride: 100 mmol/L (ref 96–106)
Creatinine, Ser: 1.01 mg/dL (ref 0.76–1.27)
GFR calc Af Amer: 100 mL/min/{1.73_m2} (ref >59)
GFR calc non Af Amer: 86 mL/min/{1.73_m2} (ref >59)
Globulin, Total: 3.7 g/dL (ref 1.5–4.5)
Glucose: 94 mg/dL (ref 65–99)
Potassium: 4.4 mmol/L (ref 3.5–5.2)
Sodium: 137 mmol/L (ref 134–144)
Total Protein: 7.7 g/dL (ref 6.0–8.5)

## 2020-02-05 LAB — PSA: Prostate Specific Ag, Serum: 0.4 ng/mL (ref 0.0–4.0)

## 2020-08-08 ENCOUNTER — Ambulatory Visit: Payer: Self-pay | Admitting: Nurse Practitioner

## 2020-08-12 ENCOUNTER — Encounter: Payer: Self-pay | Admitting: Nurse Practitioner

## 2020-08-12 ENCOUNTER — Other Ambulatory Visit: Payer: Self-pay

## 2020-08-12 ENCOUNTER — Ambulatory Visit (INDEPENDENT_AMBULATORY_CARE_PROVIDER_SITE_OTHER): Payer: Worker's Compensation | Admitting: Nurse Practitioner

## 2020-08-12 VITALS — BP 125/78 | HR 87 | Temp 98.1°F | Ht 69.5 in | Wt 261.0 lb

## 2020-08-12 DIAGNOSIS — J301 Allergic rhinitis due to pollen: Secondary | ICD-10-CM

## 2020-08-12 DIAGNOSIS — I10 Essential (primary) hypertension: Secondary | ICD-10-CM

## 2020-08-12 DIAGNOSIS — R252 Cramp and spasm: Secondary | ICD-10-CM

## 2020-08-12 MED ORDER — IBUPROFEN 800 MG PO TABS: 800.0000 mg | ORAL_TABLET | Freq: Three times a day (TID) | ORAL | 5 refills | Status: AC | PRN

## 2020-08-12 MED ORDER — CETIRIZINE HCL 10 MG PO TABS: 10.0000 mg | ORAL_TABLET | Freq: Every day | ORAL | 3 refills | Status: DC
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
## 2020-08-12 NOTE — Patient Instructions (Signed)

## 2020-08-13 LAB — COMP. METABOLIC PANEL (12)
AST: 17 IU/L (ref 0–40)
Albumin/Globulin Ratio: 1.1 — ABNORMAL LOW (ref 1.2–2.2)
Albumin: 4.2 g/dL (ref 4.0–5.0)
Alkaline Phosphatase: 96 IU/L (ref 44–121)
BUN/Creatinine Ratio: 13 (ref 9–20)
BUN: 14 mg/dL (ref 6–24)
Bilirubin Total: 0.3 mg/dL (ref 0.0–1.2)
Calcium: 9.4 mg/dL (ref 8.7–10.2)
Chloride: 101 mmol/L (ref 96–106)
Creatinine, Ser: 1.06 mg/dL (ref 0.76–1.27)
GFR calc Af Amer: 94 mL/min/{1.73_m2} (ref >59)
GFR calc non Af Amer: 81 mL/min/{1.73_m2} (ref >59)
Globulin, Total: 4 g/dL (ref 1.5–4.5)
Glucose: 97 mg/dL (ref 65–99)
Potassium: 4.3 mmol/L (ref 3.5–5.2)
Sodium: 137 mmol/L (ref 134–144)
Total Protein: 8.2 g/dL (ref 6.0–8.5)

## 2020-08-15 ENCOUNTER — Encounter: Payer: Self-pay | Admitting: Nurse Practitioner

## 2020-11-23 ENCOUNTER — Other Ambulatory Visit: Payer: Self-pay

## 2020-11-23 ENCOUNTER — Other Ambulatory Visit: Payer: 59

## 2020-11-23 DIAGNOSIS — Z20822 Contact with and (suspected) exposure to covid-19: Secondary | ICD-10-CM

## 2020-11-25 LAB — SARS-COV-2, NAA 2 DAY TAT

## 2020-11-25 LAB — NOVEL CORONAVIRUS, NAA: SARS-CoV-2, NAA: NOT DETECTED

## 2021-02-10 ENCOUNTER — Ambulatory Visit: Payer: Self-pay | Admitting: Nurse Practitioner

## 2021-04-10 ENCOUNTER — Encounter (HOSPITAL_BASED_OUTPATIENT_CLINIC_OR_DEPARTMENT_OTHER): Payer: Self-pay

## 2021-04-10 ENCOUNTER — Emergency Department (HOSPITAL_BASED_OUTPATIENT_CLINIC_OR_DEPARTMENT_OTHER)
Admission: EM | Admit: 2021-04-10 | Discharge: 2021-04-10 | Disposition: A | Discharge status: Home / Self Care | Payer: 59 | Attending: Emergency Medicine | Admitting: Emergency Medicine

## 2021-04-10 ENCOUNTER — Other Ambulatory Visit: Payer: Self-pay

## 2021-04-10 DIAGNOSIS — S0991XA Unspecified injury of ear, initial encounter: Secondary | ICD-10-CM | POA: Diagnosis present

## 2021-04-10 DIAGNOSIS — F17290 Nicotine dependence, other tobacco product, uncomplicated: Secondary | ICD-10-CM | POA: Insufficient documentation

## 2021-04-10 DIAGNOSIS — H9222 Otorrhagia, left ear: Secondary | ICD-10-CM

## 2021-04-10 DIAGNOSIS — I10 Essential (primary) hypertension: Secondary | ICD-10-CM | POA: Insufficient documentation

## 2021-04-10 DIAGNOSIS — X58XXXA Exposure to other specified factors, initial encounter: Secondary | ICD-10-CM | POA: Diagnosis not present

## 2021-04-10 DIAGNOSIS — S01312A Laceration without foreign body of left ear, initial encounter: Secondary | ICD-10-CM | POA: Insufficient documentation

## 2021-04-10 NOTE — Discharge Instructions (Signed)
You came to the emerge department today to be evaluated for your bleeding left ear.  On physical exam there is a small to your ear canal.  This is likely the source of your bleeding.  Your exam showed no signs of ear infection.  Please refrain from using Q-tips or inserting anything into your ear.  Get help right away if: You have severe pain that is not controlled with medicine. You have swelling, redness, or pain around your ear. You have stiffness in your neck. A part of your face is not moving (paralyzed). The bone behind your ear (mastoid) is tender when you touch it. You develop a severe headache.

## 2021-04-10 NOTE — ED Triage Notes (Signed)
Pt arrives with c/o bleeding in left ear. Pt states he feels like water is in his ear, denies hitting head or any other trauma.

## 2021-04-10 NOTE — ED Provider Notes (Signed)
[facility] EMERGENCY DEPARTMENT Provider Note   CSN: [id number] Arrival date & time: 04/10/21  1424     History Chief Complaint  Patient presents with  . bleeding in ear    Joel [name] is a 50 y.o. male with a history of hypertension.  Patient presents to the emergency department with a chief complaint of bleeding from his left ear.  Patient reports that this has been occurring over the last 44 hours.  Patient endorses ear discomfort," but denies any pain.  Patient denies any hearing loss, ear discharge, neck pain, neck stiffness, fevers, chills.  Patient reports that he uses Q-tips regularly to remove earwax.  Patient denies any recent falls or injuries.  Patient denies any barotrauma or recent flying.  HPI     Past Medical History:  Diagnosis Date  . Allergy   . Chicken pox   . Hypertension   . Kidney stones     Patient Active Problem List   Diagnosis Date Noted  . Routine general medical examination at a health care facility 02/10/2015  . Anxiety and depression 02/10/2015  . Essential hypertension 01/03/2015    Past Surgical History:  Procedure Laterality Date  . KNEE SURGERY         Family History  Problem Relation Age of Onset  . Heart attack Mother   . Ovarian cancer Mother   . Arthritis Mother   . Leukemia Mother   . Heart attack Father   . Heart attack Maternal Grandmother   . Arthritis Maternal Grandmother   . Diabetes Maternal Grandmother   . Hypertension Maternal Grandmother   . Stroke Paternal Grandmother     Social History   Tobacco Use  . Smoking status: Never Smoker  . Smokeless tobacco: Current User    Types: Chew  Vaping Use  . Vaping Use: Never used  Substance Use Topics  . Alcohol use: Yes    Comment: Rarely  . Drug use: No    Home Medications Prior to Admission medications   Medication Sig Start Date End Date Taking? Authorizing Provider  acetaminophen (TYLENOL) 500 MG tablet Take 500 mg by mouth every 6  (six) hours as needed. Take two tabs daily    [provider]  cetirizine (ZYRTEC) 10 MG tablet Take 1 tablet (10 mg total) by mouth daily. 08/12/20 08/12/21  Barbette Merino, NP  ibuprofen (ADVIL) 800 MG tablet Take 1 tablet (800 mg total) by mouth every 8 (eight) hours as needed. 08/12/20 08/12/21  Barbette Merino, NP    Allergies    Penicillins  Review of Systems   Review of Systems  Constitutional: Negative for chills and fever.  HENT: Negative for ear discharge, ear pain and hearing loss.   Musculoskeletal: Negative for neck pain and neck stiffness.    Physical Exam Updated Vital Signs BP 118/72 (BP Location: Right Arm)   Pulse 92   Temp 98.5 F (36.9 C) (Oral)   Resp 18   Ht 5\' 11"  (1.803 m)   Wt 127 kg   SpO2 100%   BMI 39.05 kg/m   Physical Exam Vitals and nursing note reviewed.  Constitutional:      General: He is not in acute distress.    Appearance: He is not ill-appearing, toxic-appearing or diaphoretic.  HENT:     Head: Normocephalic.     Right Ear: Tympanic membrane, ear canal and external ear normal. No drainage, swelling or tenderness. No middle ear effusion. There is no impacted  cerumen. No foreign body. No mastoid tenderness.     Left Ear: Tympanic membrane and external ear normal. Laceration (inside right ear canal) present. No drainage, swelling or tenderness.  No middle ear effusion. There is no impacted cerumen. No foreign body. No mastoid tenderness. No hemotympanum. Tympanic membrane is not injected, scarred, perforated, erythematous, retracted or bulging.  Eyes:     General: No scleral icterus.       Right eye: No discharge.        Left eye: No discharge.  Cardiovascular:     Rate and Rhythm: Normal rate.  Pulmonary:     Effort: Pulmonary effort is normal.  Musculoskeletal:     Cervical back: Full passive range of motion without pain and normal range of motion.  Skin:    General: Skin is warm and dry.  Neurological:     General: No focal  deficit present.     Mental Status: He is alert.     GCS: GCS eye subscore is 4. GCS verbal subscore is 5. GCS motor subscore is 6.  Psychiatric:        Behavior: Behavior is cooperative.     ED Results / Procedures / Treatments   Labs (all labs ordered are listed, but only abnormal results are displayed) Labs Reviewed - No data to display  EKG None  Radiology No results found.  Procedures Procedures   Medications Ordered in ED Medications - No data to display  ED Course  I have reviewed the triage vital signs and the nursing notes.  Pertinent labs & imaging results that were available during my care of the patient were reviewed by me and considered in my medical decision making (see chart for details).    MDM Rules/Calculators/A&P                          Alert 50 year old male no acute distress, nontoxic-appearing.  Patient presents with chief complaint of bleeding from left ear.  Patient denies any hearing loss, ear discharge, neck pain, neck stiffness, fevers, chills.  Patient reports that he uses Q-tips regularly to remove earwax.  Patient denies any recent falls or injuries.  Patient denies any barotrauma or recent flying.  Patient has laceration to left ear canal.  This is likely the source of his bleeding.  Advised patient to avoid using Q-tips or putting anything else in his ear.  Patient given strict return precautions.  Patient expressed understanding of all instructions and is agreeable with this plan.  Final Clinical Impression(s) / ED Diagnoses Final diagnoses:  Bleeding from left ear    Rx / DC Orders ED Discharge Orders    None       Haskel Schroeder, PA-C 04/10/21 1547    Joel Fossa, MD 04/13/21 1451

## 2021-10-03 ENCOUNTER — Other Ambulatory Visit: Payer: Self-pay

## 2021-10-03 ENCOUNTER — Encounter (HOSPITAL_BASED_OUTPATIENT_CLINIC_OR_DEPARTMENT_OTHER): Payer: Self-pay | Admitting: Emergency Medicine

## 2021-10-03 ENCOUNTER — Emergency Department (HOSPITAL_BASED_OUTPATIENT_CLINIC_OR_DEPARTMENT_OTHER)
Admission: EM | Admit: 2021-10-03 | Discharge: 2021-10-03 | Disposition: A | Discharge status: Home / Self Care | Payer: BC Managed Care – PPO | Attending: Emergency Medicine | Admitting: Emergency Medicine

## 2021-10-03 DIAGNOSIS — F17220 Nicotine dependence, chewing tobacco, uncomplicated: Secondary | ICD-10-CM | POA: Diagnosis not present

## 2021-10-03 DIAGNOSIS — R0981 Nasal congestion: Secondary | ICD-10-CM | POA: Insufficient documentation

## 2021-10-03 DIAGNOSIS — I10 Essential (primary) hypertension: Secondary | ICD-10-CM | POA: Diagnosis not present

## 2021-10-03 DIAGNOSIS — R059 Cough, unspecified: Secondary | ICD-10-CM | POA: Diagnosis not present

## 2021-10-03 DIAGNOSIS — Z20822 Contact with and (suspected) exposure to covid-19: Secondary | ICD-10-CM | POA: Insufficient documentation

## 2021-10-03 DIAGNOSIS — R509 Fever, unspecified: Secondary | ICD-10-CM | POA: Insufficient documentation

## 2021-10-03 DIAGNOSIS — R519 Headache, unspecified: Secondary | ICD-10-CM | POA: Diagnosis not present

## 2021-10-03 LAB — RESP PANEL BY RT-PCR (FLU A&B, COVID) ARPGX2
Influenza A by PCR: NEGATIVE
Influenza B by PCR: NEGATIVE
SARS Coronavirus 2 by RT PCR: NEGATIVE

## 2021-10-03 MED ORDER — FLUTICASONE PROPIONATE 50 MCG/ACT NA SUSP: 2.0000 | Freq: Every day | NASAL | 0 refills | Status: DC

## 2021-10-03 NOTE — Discharge Instructions (Addendum)
You tested negative for COVID and flu today.  Still likely a viral illness that will resolve on its own.  Please get plenty of rest drink plenty of fluids.  DayQuil/NyQuil for fever and aches and pains.  Fluticasone nasal spray for nasal congestion and sinus headache.  Please return to the emergency department if you experience worsening symptoms, severe and worsening cough, fever that is getting worse, intractable vomiting/diarrhea, or other concerns you might have.

## 2021-10-03 NOTE — ED Provider Notes (Signed)
[facility] EMERGENCY DEPT Provider Note   CSN: [id number] Arrival date & time: 10/03/21  1617     History Chief Complaint  Patient presents with   Nasal Congestion    Joel [name] is a 50 y.o. male who presents the emergency department with a 3-day history of nasal congestion, headache, and intermittent cough.  He denies any sick contacts.  He reports associated subjective fever which is briefly improved with Tylenol and ibuprofen.  He denies any abdominal pain, nausea, vomiting, diarrhea, urinary complaints, chest pain, shortness of breath, sore throat.  Headache is worse with lying supine and bending forward.  Described as a pressure sensation.  HPI     Past Medical History:  Diagnosis Date   Allergy    Chicken pox    Hypertension    Kidney stones     Patient Active Problem List   Diagnosis Date Noted   Routine general medical examination at a health care facility 02/10/2015   Anxiety and depression 02/10/2015   Essential hypertension 01/03/2015    Past Surgical History:  Procedure Laterality Date   KNEE SURGERY         Family History  Problem Relation Age of Onset   Heart attack Mother    Ovarian cancer Mother    Arthritis Mother    Leukemia Mother    Heart attack Father    Heart attack Maternal Grandmother    Arthritis Maternal Grandmother    Diabetes Maternal Grandmother    Hypertension Maternal Grandmother    Stroke Paternal Grandmother     Social History   Tobacco Use   Smoking status: Never   Smokeless tobacco: Current    Types: Chew  Vaping Use   Vaping Use: Never used  Substance Use Topics   Alcohol use: Yes    Comment: Rarely   Drug use: No    Home Medications Prior to Admission medications   Medication Sig Start Date End Date Taking? Authorizing Provider  fluticasone (FLONASE) 50 MCG/ACT nasal spray Place 2 sprays into both nostrils daily. 10/03/21  Yes Meredeth Ide, Betheny Suchecki M, PA-C  acetaminophen (TYLENOL) 500 MG  tablet Take 500 mg by mouth every 6 (six) hours as needed. Take two tabs daily    [provider]  cetirizine (ZYRTEC) 10 MG tablet Take 1 tablet (10 mg total) by mouth daily. 08/12/20 08/12/21  Barbette Merino, NP    Allergies    Penicillins  Review of Systems   Review of Systems  All other systems reviewed and are negative.  Physical Exam Updated Vital Signs BP (!) 144/92 (BP Location: Right Arm)   Pulse 92   Temp 98.5 F (36.9 C)   Resp 18   Ht 5\' 11"  (1.803 m)   Wt 127 kg   SpO2 99%   BMI 39.05 kg/m   Physical Exam Vitals and nursing note reviewed.  Constitutional:      Appearance: Normal appearance.  HENT:     Head: Normocephalic and atraumatic.  Eyes:     General:        Right eye: No discharge.        Left eye: No discharge.     Conjunctiva/sclera: Conjunctivae normal.  Cardiovascular:     Rate and Rhythm: Normal rate and regular rhythm.  Pulmonary:     Effort: Pulmonary effort is normal. No respiratory distress.     Breath sounds: Normal breath sounds.  Skin:    General: Skin is warm and dry.  Findings: No rash.  Neurological:     General: No focal deficit present.     Mental Status: He is alert.  Psychiatric:        Mood and Affect: Mood normal.        Behavior: Behavior normal.    ED Results / Procedures / Treatments   Labs (all labs ordered are listed, but only abnormal results are displayed) Labs Reviewed  RESP PANEL BY RT-PCR (FLU A&B, COVID) ARPGX2    EKG None  Radiology No results found.  Procedures Procedures   Medications Ordered in ED Medications - No data to display  ED Course  I have reviewed the triage vital signs and the nursing notes.  Pertinent labs & imaging results that were available during my care of the patient were reviewed by me and considered in my medical decision making (see chart for details).    MDM Rules/Calculators/A&P                          Joel [name] is a 50 y.o. male who presents  to the emergency department with upper respiratory infection type symptoms.  Patient is in no acute distress at this time and is oxygenating well in a 9% on room air.  He is in no respiratory distress.  COVID and influenza were negative.  This is likely a viral illness that will resolve on its own.  I have given him a prescription for fluticasone for nasal congestion and sinus pressure.  Lungs were clear to have a low suspicion for pneumonia at this time. Strict return precautions were given.  He is safe for discharge.   Final Clinical Impression(s) / ED Diagnoses Final diagnoses:  Nasal congestion    Rx / DC Orders ED Discharge Orders          Ordered    fluticasone (FLONASE) 50 MCG/ACT nasal spray  Daily        10/03/21 2157             Teressa Lower, PA-C 10/03/21 2201    Vanetta Mulders, MD 10/05/21 1920

## 2021-10-03 NOTE — ED Notes (Signed)
Cough, chills body aches x 4 days, family at bedside

## 2021-10-03 NOTE — ED Notes (Signed)
Pt d/c home per MD order. Discharge summary reviewed, pt verbalizes understanding. Ambulatory off unit. No s/s of acute distress noted at discharge.  °

## 2021-10-03 NOTE — ED Triage Notes (Signed)
Pt arrives pov with c/o cough, HA, congestion, chills and sweats x 4 days

## 2021-10-03 NOTE — ED Notes (Addendum)
Triage delayed, pt in restroom 

## 2022-02-28 ENCOUNTER — Other Ambulatory Visit: Payer: Self-pay

## 2022-02-28 ENCOUNTER — Encounter (HOSPITAL_BASED_OUTPATIENT_CLINIC_OR_DEPARTMENT_OTHER): Payer: Self-pay

## 2022-02-28 ENCOUNTER — Emergency Department (HOSPITAL_BASED_OUTPATIENT_CLINIC_OR_DEPARTMENT_OTHER)
Admission: EM | Admit: 2022-02-28 | Discharge: 2022-02-28 | Disposition: A | Discharge status: Home / Self Care | Payer: BC Managed Care – PPO | Attending: Emergency Medicine | Admitting: Emergency Medicine

## 2022-02-28 DIAGNOSIS — X58XXXA Exposure to other specified factors, initial encounter: Secondary | ICD-10-CM | POA: Diagnosis not present

## 2022-02-28 DIAGNOSIS — S0501XA Injury of conjunctiva and corneal abrasion without foreign body, right eye, initial encounter: Secondary | ICD-10-CM | POA: Insufficient documentation

## 2022-02-28 DIAGNOSIS — S0591XA Unspecified injury of right eye and orbit, initial encounter: Secondary | ICD-10-CM | POA: Diagnosis not present

## 2022-02-28 MED ORDER — TETRACAINE HCL 0.5 % OP SOLN
2.0000 [drp] | Freq: Once | OPHTHALMIC | Status: DC
  Filled 2022-02-28: qty 4

## 2022-02-28 MED ORDER — TOBRAMYCIN 0.3 % OP SOLN: 2.0000 [drp] | OPHTHALMIC | 0 refills | Status: DC

## 2022-02-28 MED ORDER — TOBRAMYCIN 0.3 % OP SOLN
2.0000 [drp] | OPHTHALMIC | 0 refills | Status: AC
Start: 2022-02-28 — End: 2022-03-08

## 2022-02-28 MED ORDER — FLUORESCEIN SODIUM 1 MG OP STRP
1.0000 | ORAL_STRIP | Freq: Once | OPHTHALMIC | Status: DC
  Filled 2022-02-28: qty 1

## 2022-02-28 NOTE — ED Triage Notes (Signed)
Pt presents with R eye drainage upon waking this AM. Pt cleaned out a dusty cage last PM and rubbed his eye afterwards.  ?

## 2022-02-28 NOTE — ED Provider Notes (Signed)
?[facility] EMERGENCY DEPARTMENT ?Provider Note ? ? ?CSN: [id number] ?Arrival date & time: 02/28/22  1425 ? ?  ? ?History ? ?Chief Complaint  ?Patient presents with  ? Eye Drainage  ? ? ?Joel [name] is a 51 y.o. male.  Patient Presents with chief complaint of right eye drainage secondary to getting dust in the eye last night.  Patient states he woke this morning with "crusty's" in his right eye with redness and drainage.  Patient also complains of mild pain in the right eye.  Patient does wear contact lenses.  Patient denies vision change.  Endorses right eye discomfort, drainage.  No relevant past medical history ? ?HPI ? ?  ? ?Home Medications ?Prior to Admission medications   ?Medication Sig Start Date End Date Taking? Authorizing Provider  ?tobramycin (TOBREX) 0.3 % ophthalmic solution Place 2 drops into the right eye every 4 (four) hours for 8 days. 02/28/22 03/08/22 Yes Darrick Grinder, PA-C  ?acetaminophen (TYLENOL) 500 MG tablet Take 500 mg by mouth every 6 (six) hours as needed. Take two tabs daily    [provider]  ?cetirizine (ZYRTEC) 10 MG tablet Take 1 tablet (10 mg total) by mouth daily. 08/12/20 08/12/21  Barbette Merino, NP  ?fluticasone (FLONASE) 50 MCG/ACT nasal spray Place 2 sprays into both nostrils daily. 10/03/21   Teressa Lower, PA-C  ?   ? ?Allergies    ?Penicillins   ? ?Review of Systems   ?Review of Systems  ?Eyes:  Positive for pain, discharge and redness.  ? ?Physical Exam ?Updated Vital Signs ?Ht 5' 11.5" (1.816 m)   Wt 127 kg   BMI 38.51 kg/m?  ?Physical Exam ?Vitals and nursing note reviewed.  ?Eyes:  ?   General: Lids are normal. Vision grossly intact.     ?   Right eye: Discharge present. No foreign body.     ?   Left eye: No foreign body.  ?   Extraocular Movements: Extraocular movements intact.  ?   Conjunctiva/sclera:  ?   Right eye: Right conjunctiva is injected.  ? ? ?ED Results / Procedures / Treatments   ?Labs ?(all labs ordered are listed, but  only abnormal results are displayed) ?Labs Reviewed - No data to display ? ?EKG ?None ? ?Radiology ?No results found. ? ?Procedures ?Procedures  ? ? ?Medications Ordered in ED ?Medications  ?tetracaine (PONTOCAINE) 0.5 % ophthalmic solution 2 drop (2 drops Right Eye Given by Other 02/28/22 1445)  ?fluorescein ophthalmic strip 1 strip (1 strip Right Eye Given by Other 02/28/22 1445)  ? ? ?ED Course/ Medical Decision Making/ A&P ?  ?                        ?Medical Decision Making ?Risk ?Prescription drug management. ? ? ?Patient presents with right eye discharge and pain.  Patient has history of corneal abrasion of the right and states this pain feels the same. ? ?Physical exam concerning for possible conjunctivitis versus corneal abrasion.  No visual acuity changes.  Mild scleral injection.  Mild discharge noted.  Plan on discharge on tobramycin drops to cover for both bacterial conjunctivitis and corneal abrasion.  Patient in agreement with this plan.  Patient works in [employer] and carries a gun.  Plan on keeping patient out of work through Friday to allow for him to return to wearing contact lenses prior to resumption of work. ? ?Final Clinical Impression(s) / ED Diagnoses ?  Final diagnoses:  ?Abrasion of right cornea, initial encounter  ? ? ?Rx / DC Orders ?ED Discharge Orders   ? ?      Ordered  ?  tobramycin (TOBREX) 0.3 % ophthalmic solution  Every 4 hours       ? 02/28/22 1531  ? ?  ?  ? ?  ? ? ?  ?Darrick Grinder, PA-C ?02/28/22 1532 ? ?  ?Terrilee Files, MD ?02/28/22 1657 ? ?

## 2022-02-28 NOTE — Discharge Instructions (Addendum)
You were seen today for discharge from the right eye concerning for possible corneal abrasion.  As discussed, I provided antibiotic eyedrops which will cover for both bacterial conjunctivitis and corneal abrasion.  Recommend not resuming contact use until the eye is clear.  I have provided a work note to keep you out of work through Friday.  Recommend follow-up with ophthalmology if you have continued concerns ?

## 2022-03-07 ENCOUNTER — Encounter (HOSPITAL_BASED_OUTPATIENT_CLINIC_OR_DEPARTMENT_OTHER): Payer: Self-pay

## 2022-03-07 ENCOUNTER — Other Ambulatory Visit: Payer: Self-pay

## 2022-03-07 ENCOUNTER — Emergency Department (HOSPITAL_BASED_OUTPATIENT_CLINIC_OR_DEPARTMENT_OTHER)
Admission: EM | Admit: 2022-03-07 | Discharge: 2022-03-07 | Disposition: A | Discharge status: Home / Self Care | Payer: BC Managed Care – PPO | Attending: Emergency Medicine | Admitting: Emergency Medicine

## 2022-03-07 ENCOUNTER — Emergency Department (HOSPITAL_BASED_OUTPATIENT_CLINIC_OR_DEPARTMENT_OTHER): Payer: BC Managed Care – PPO

## 2022-03-07 DIAGNOSIS — S8992XA Unspecified injury of left lower leg, initial encounter: Secondary | ICD-10-CM | POA: Diagnosis not present

## 2022-03-07 DIAGNOSIS — S8002XA Contusion of left knee, initial encounter: Secondary | ICD-10-CM | POA: Insufficient documentation

## 2022-03-07 DIAGNOSIS — M25562 Pain in left knee: Secondary | ICD-10-CM | POA: Diagnosis not present

## 2022-03-07 DIAGNOSIS — X501XXA Overexertion from prolonged static or awkward postures, initial encounter: Secondary | ICD-10-CM | POA: Diagnosis not present

## 2022-03-07 DIAGNOSIS — I10 Essential (primary) hypertension: Secondary | ICD-10-CM | POA: Insufficient documentation

## 2022-03-07 DIAGNOSIS — M25462 Effusion, left knee: Secondary | ICD-10-CM | POA: Diagnosis not present

## 2022-03-07 DIAGNOSIS — Y990 Civilian activity done for income or pay: Secondary | ICD-10-CM | POA: Diagnosis not present

## 2022-03-07 DIAGNOSIS — M1712 Unilateral primary osteoarthritis, left knee: Secondary | ICD-10-CM | POA: Diagnosis not present

## 2022-03-07 MED ORDER — IBUPROFEN 800 MG PO TABS: 800.0000 mg | ORAL_TABLET | Freq: Three times a day (TID) | ORAL | 0 refills | Status: DC

## 2022-03-07 NOTE — ED Triage Notes (Signed)
Pt c/o L knee pain since last PM.  ?

## 2022-03-07 NOTE — ED Provider Notes (Signed)
?[facility] EMERGENCY DEPARTMENT ?Provider Note ? ? ?CSN: [id number] ?Arrival date & time: 03/07/22  1613 ? ?  ? ?[id number] ? ?Chief Complaint  ?Patient presents with  ? Knee Pain  ? ? ?Joel [name] is a 51 y.o. male.  With past medical history of hypertension and right-sided knee surgery who presents to the emergency department with left-sided knee pain. ? ?Patient states that around 1 AM last night he got up from his chair at work when he felt a pop in his left knee.  He states that he immediately had pain.  No falls or trauma.  He states that overnight and today he has had mild swelling and pain with ambulating.  He denies any pain at the left ankle or hip.  No previous surgeries to the right knee. ? ? ?Knee Pain ? ?  ? ?Home Medications ?Prior to Admission medications   ?Medication Sig Start Date End Date Taking? Authorizing Provider  ?acetaminophen (TYLENOL) 500 MG tablet Take 500 mg by mouth every 6 (six) hours as needed. Take two tabs daily    [provider]  ?cetirizine (ZYRTEC) 10 MG tablet Take 1 tablet (10 mg total) by mouth daily. 08/12/20 08/12/21  Barbette MerinoKing, Crystal M, NP  ?fluticasone (FLONASE) 50 MCG/ACT nasal spray Place 2 sprays into both nostrils daily. 10/03/21   Honor LohFleming, Conner M, PA-C  ?tobramycin (TOBREX) 0.3 % ophthalmic solution Place 2 drops into the right eye every 4 (four) hours for 8 days. 02/28/22 03/08/22  Darrick GrinderMcCauley, Larry B, PA-C  ?   ? ?Allergies    ?Penicillins   ? ?Review of Systems   ?Review of Systems  ?Musculoskeletal:  Positive for arthralgias and joint swelling.  ?All other systems reviewed and are negative. ? ?Physical Exam ?Updated Vital Signs ?BP (!) 101/91 (BP Location: Left Arm)   Pulse (!) 101   Temp 97.6 ?F (36.4 ?C)   Resp 18   Ht 5' 11.5" (1.816 m)   Wt 122.5 kg   SpO2 100%   BMI 37.13 kg/m?  ?Physical Exam ?Vitals and nursing note reviewed.  ?Constitutional:   ?   General: He is not in acute distress. ?   Appearance: Normal appearance. He is normal  weight. He is not ill-appearing or toxic-appearing.  ?HENT:  ?   Head: Normocephalic and atraumatic.  ?Eyes:  ?   General: No scleral icterus. ?Cardiovascular:  ?   Pulses: Normal pulses.  ?Pulmonary:  ?   Effort: Pulmonary effort is normal. No respiratory distress.  ?Musculoskeletal:     ?   General: Tenderness present. No swelling or deformity. Normal range of motion.  ?   Left knee: Ecchymosis present. No swelling, deformity, effusion or bony tenderness. Normal range of motion. Tenderness present over the lateral joint line. Normal pulse.  ?   Left lower leg: No edema.  ?   Comments: Right knee clavicle without effusion, there is a small bruise that patient states was there prior to injury. Lateral joint line tenderness without laxity. Neurovascularly intact. Compartments soft.,  Mildly decreased due to pain.  X-ray is unremarkable.  X-ray  ?Skin: ?   General: Skin is warm and dry.  ?   Capillary Refill: Capillary refill takes less than 2 seconds.  ?   Findings: Bruising present.  ?Neurological:  ?   General: No focal deficit present.  ?   Mental Status: He is alert and oriented to person, place, and time. Mental status is at baseline.  ?  Sensory: No sensory deficit.  ?   Motor: No weakness.  ?Psychiatric:     ?   Mood and Affect: Mood normal.     ?   Behavior: Behavior normal.     ?   Thought Content: Thought content normal.     ?   Judgment: Judgment normal.  ? ? ?ED Results / Procedures / Treatments   ?Labs ?(all labs ordered are listed, but only abnormal results are displayed) ?Labs Reviewed - No data to display ? ?EKG ?None ? ?Radiology ?DG Knee Complete 4 Views Left ? ?Result Date: 03/07/2022 ?CLINICAL DATA:  Pain EXAM: LEFT KNEE - COMPLETE 4+ VIEW COMPARISON:  None Available. FINDINGS: No recent fracture or dislocation is seen. There is slight prominence of suprapatellar bursa suggesting small effusion. Minimal bony spurs seen. IMPRESSION: No recent fracture or dislocation is seen in the left knee.  Possible small effusion is present in the suprapatellar bursa. Degenerative changes are noted with minimal bony spurs. Electronically Signed   By: Ernie Avena M.D.   On: 03/07/2022 17:22   ? ?Procedures ?Procedures  ? ?Medications Ordered in ED ?Medications - No data to display ? ?ED Course/ Medical Decision Making/ A&P ?  ?                        ?Medical Decision Making ?Amount and/or Complexity of Data Reviewed ?Radiology: ordered. ? ?Risk ?Prescription drug management. ? ?This patient presents to the ED for concern of knee pain, this involves an extensive number of treatment options, and is a complaint that carries with it a high risk of complications and morbidity.  The differential diagnosis includes sprain, strain, dislocation, fracture, effusion, gout, septic joint, etc.  ? ?Co morbidities that complicate the patient evaluation ?Obesity  ? ?Additional history obtained:  ?Additional history obtained from: wife at bedside  ?External records from outside source obtained and reviewed including: none  ? ?EKG: ?Not indicated ? ?Cardiac Monitoring: ?Not indicated  ? ?Lab Results: ?Not indicated ? ? ?Imaging Studies ordered:  ?I ordered imaging studies which included x-ray.  I independently reviewed & interpreted imaging & am in agreement with radiology impression. ?Imaging shows: ?X-ray left knee negative  ? ?Medications  ?I ordered medication including n/a for n/a ?Reevaluation of the patient after medication shows that patient  n/a ?-I reviewed the patient's home medications and did not make adjustments. ?-I did  prescribe new home medications. ? ?Tests Considered: ?CT knee - MRI would be preferred for any ligamentous injury. Will conservatively treat and f/u with sports medicine for their opinion.  ? ?Critical Interventions: ?Knee brace ? ?Consultations: ?None required  ? ?SDH ?None identified  ? ?ED Course: ?51 year old male who presents emergency department with left knee pain. ? ?Physical exam with  mild ecchymoses however no obvious deformity, swelling or effusion.  He has range of motion and strength is 4/5 mildly decreased due to pain. ? ?X-ray negative  ? ?Likely sprained his knee. There is no fracture or dislocation or effusion. There is no redness or erythema, warmth fever concerning for septic joint. Not consistent with gout. Doubt gonococcal arthropathy.  ? ?Will provide with NSAIDs. Placed in hinged knee brace and will give f/u to sports medicine PRN. Instructed to continue to ice and elevate. He verbalizes understanding. Return for inability to ambulate. ? ?After consideration of the diagnostic results and the patients response to treatment, I feel that the patent would benefit from discharge. ?The patient has been  appropriately medically screened and/or stabilized in the ED. I have low suspicion for any other emergent medical condition which would require further screening, evaluation or treatment in the ED or require inpatient management. The patient is overall well appearing and non-toxic in appearance. They are hemodynamically stable at time of discharge.   ?Final Clinical Impression(s) / ED Diagnoses ?Final diagnoses:  ?Acute pain of left knee  ? ? ?Rx / DC Orders ?ED Discharge Orders   ? ?      Ordered  ?  ibuprofen (ADVIL) 800 MG tablet  3 times daily       ? 03/07/22 1734  ? ?  ?  ? ?  ? ? ?  ?Cristopher Peru, PA-C ?03/07/22 1820 ? ?  ?Arby Barrette, MD ?03/12/22 1258 ? ?

## 2022-03-07 NOTE — Discharge Instructions (Addendum)
You were seen in the emergency department today for pain to your left knee.  You have likely sprained the knee.  We are giving a prescription for ibuprofen and a brace.  Use for comfort.  Continue to ice over the next 1 to 2 weeks.  Additionally have given you information for Clare Gandy, MD who is a sports medicine physician.  Please follow-up with him if you are having ongoing pain symptoms after 1 to 2 weeks.  Please return to the emergency department if you are unable to walk on your leg. ?

## 2022-03-19 ENCOUNTER — Ambulatory Visit (INDEPENDENT_AMBULATORY_CARE_PROVIDER_SITE_OTHER): Payer: BC Managed Care – PPO | Admitting: Family Medicine

## 2022-03-19 ENCOUNTER — Ambulatory Visit: Payer: Self-pay

## 2022-03-19 VITALS — BP 124/80 | Ht 71.5 in | Wt 265.0 lb

## 2022-03-19 DIAGNOSIS — M25562 Pain in left knee: Secondary | ICD-10-CM

## 2022-03-19 DIAGNOSIS — S83002A Unspecified subluxation of left patella, initial encounter: Secondary | ICD-10-CM

## 2022-03-19 DIAGNOSIS — S83002D Unspecified subluxation of left patella, subsequent encounter: Secondary | ICD-10-CM | POA: Insufficient documentation

## 2022-03-19 DIAGNOSIS — M1611 Unilateral primary osteoarthritis, right hip: Secondary | ICD-10-CM

## 2022-03-19 NOTE — Progress Notes (Signed)
?  Joel [name] - 51 y.o. male MRN [id number]  Date of birth: September 11, 1971 ? ?SUBJECTIVE:  Including CC & ROS.  ?No chief complaint on file. ? ? ?Joel [name] is a 51 y.o. male that is presenting with acute left knee pain and presenting with acute on chronic right hip pain.  Knee pain started a few days ago while at work.  He felt a pop in his knee.  Having pain over the lateral aspect.  No history of similar pain.  The right hip is bothering him intermittently for years.  He does have a history of right knee surgery. ? ? ?Review of Systems ?See HPI  ? ?HISTORY: Past Medical, Surgical, Social, and Family History Reviewed & Updated per EMR.   ?Pertinent Historical Findings include: ? ?Past Medical History:  ?Diagnosis Date  ? Allergy   ? Chicken pox   ? Hypertension   ? Kidney stones   ? ? ?Past Surgical History:  ?Procedure Laterality Date  ? KNEE SURGERY    ? ? ? ?PHYSICAL EXAM:  ?VS: BP 124/80   Ht 5' 11.5" (1.816 m)   Wt 265 lb (120.2 kg)   BMI 36.44 kg/m?  ?Physical Exam ?Gen: NAD, alert, cooperative with exam, well-appearing ?MSK:  ?Neurovascularly intact   ? ?Limited ultrasound: Left knee: ? ?Mild effusion suprapatellar pouch. ?Normal-appearing quadricep and patellar tendon. ?No acute changes of the medial or lateral joint space. ?There does appear to be hyperemia associated lateral retinaculum. ? ?Summary: Findings associated with either patellofemoral syndrome versus patellar subluxation. ? ?Ultrasound and interpretation by Clearance Coots, MD ? ? ? ?ASSESSMENT & PLAN:  ? ?Patellar subluxation, left, initial encounter ?Acutely occurring.  Having a mild effusion with tenderness of the lateral aspect.  No dislocation with possible for subluxation versus patellofemoral pain. ?-Counseled on home exercise therapy and supportive care. ?-Green sport insoles. ?-Reaction brace. ?-Could consider physical therapy, injection or further imaging. ? ?Primary osteoarthritis of right hip ?Acute on chronic in nature.   He does have a significant surgical history of his right knee.  His right hip has compensated. ?-Counseled on home exercise therapy and supportive care. ?-X-ray. ?-Could consider injection. ? ? ? ? ?

## 2022-03-19 NOTE — Assessment & Plan Note (Signed)
Acute on chronic in nature.  He does have a significant surgical history of his right knee.  His right hip has compensated. ?-Counseled on home exercise therapy and supportive care. ?-X-ray. ?-Could consider injection. ?

## 2022-03-19 NOTE — Patient Instructions (Signed)
Nice to meet you ?Please try ice as needed  ?Please try the exercises  ?You can use the brace ? I will call with the xrays when they are resulted  ?Please send me a message in MyChart with any questions or updates.  ?Please see me back in 4 weeks.  ? ?--Dr. Raeford Razor ? ?

## 2022-03-19 NOTE — Assessment & Plan Note (Signed)
Acutely occurring.  Having a mild effusion with tenderness of the lateral aspect.  No dislocation with possible for subluxation versus patellofemoral pain. ?-Counseled on home exercise therapy and supportive care. ?-Green sport insoles. ?-Reaction brace. ?-Could consider physical therapy, injection or further imaging. ?

## 2022-03-21 ENCOUNTER — Ambulatory Visit (HOSPITAL_BASED_OUTPATIENT_CLINIC_OR_DEPARTMENT_OTHER)
Admission: RE | Admit: 2022-03-21 | Discharge: 2022-03-21 | Disposition: A | Discharge status: Home / Self Care | Payer: BC Managed Care – PPO | Source: Ambulatory Visit | Attending: Family Medicine | Admitting: Family Medicine

## 2022-03-21 DIAGNOSIS — M25551 Pain in right hip: Secondary | ICD-10-CM | POA: Diagnosis not present

## 2022-03-21 DIAGNOSIS — M1611 Unilateral primary osteoarthritis, right hip: Secondary | ICD-10-CM | POA: Diagnosis not present

## 2022-03-22 ENCOUNTER — Telehealth: Payer: Self-pay | Admitting: Family Medicine

## 2022-03-22 MED ORDER — PREDNISONE 5 MG PO TABS: ORAL_TABLET | ORAL | 0 refills | Status: DC

## 2022-03-22 NOTE — Telephone Encounter (Signed)
Provided prednisone.   Myra Rude, MD [facility] 03/22/2022, 9:01 AM

## 2022-03-22 NOTE — Telephone Encounter (Signed)
Pt called says pharmacy/[facility] doesn't have Rx from provider for Prednisone.  --Forwarding message to med asst for review w/provider.  -Pt uses  :  [facility] [phone] - [place], [place] - [address] Phone:  [phone]  Fax:  [phone]     --glh

## 2022-03-23 ENCOUNTER — Telehealth: Payer: Self-pay | Admitting: Family Medicine

## 2022-03-23 NOTE — Telephone Encounter (Signed)
Informed on results.   Myra Rude, MD [facility] 03/23/2022, 12:46 PM

## 2022-03-26 ENCOUNTER — Encounter: Payer: Self-pay | Admitting: Family Medicine

## 2022-03-26 ENCOUNTER — Telehealth: Payer: Self-pay | Admitting: Family Medicine

## 2022-03-26 DIAGNOSIS — M1611 Unilateral primary osteoarthritis, right hip: Secondary | ICD-10-CM

## 2022-03-26 MED ORDER — HYDROCODONE-ACETAMINOPHEN 5-325 MG PO TABS: 1.0000 | ORAL_TABLET | Freq: Three times a day (TID) | ORAL | 0 refills | Status: DC | PRN

## 2022-03-26 NOTE — Telephone Encounter (Signed)
Patient called states he wants Dr.Schmitz to go forward with the Ortho Surgical referral for his Hip Replacement.-- Pt called to request provider call him back to discuss referral. @ [phone].  --glh

## 2022-03-26 NOTE — Telephone Encounter (Signed)
Referral faxed to [facility].

## 2022-04-04 MED ORDER — ONDANSETRON HCL 4 MG PO TABS: 4.0000 mg | ORAL_TABLET | Freq: Three times a day (TID) | ORAL | 0 refills | Status: AC | PRN

## 2022-04-06 MED ORDER — HYDROCODONE-ACETAMINOPHEN 5-325 MG PO TABS: 1.0000 | ORAL_TABLET | Freq: Four times a day (QID) | ORAL | 0 refills | Status: AC | PRN

## 2022-04-10 DIAGNOSIS — M1611 Unilateral primary osteoarthritis, right hip: Secondary | ICD-10-CM | POA: Diagnosis not present

## 2022-04-16 ENCOUNTER — Ambulatory Visit (INDEPENDENT_AMBULATORY_CARE_PROVIDER_SITE_OTHER): Payer: BC Managed Care – PPO | Admitting: Family Medicine

## 2022-04-16 VITALS — BP 124/80 | Ht 71.5 in | Wt 265.0 lb

## 2022-04-16 DIAGNOSIS — M1611 Unilateral primary osteoarthritis, right hip: Secondary | ICD-10-CM | POA: Diagnosis not present

## 2022-04-16 DIAGNOSIS — S83002D Unspecified subluxation of left patella, subsequent encounter: Secondary | ICD-10-CM

## 2022-04-16 NOTE — Progress Notes (Signed)
  Joel [name] - 51 y.o. male MRN [id number]  Date of birth: [birth year]  SUBJECTIVE:  Including CC & ROS.  No chief complaint on file.   Joel [name] is a 51 y.o. male that is  following up for his knee pain and hip pain. He has scheduled surgery for his hip. His knee is still giving him some pain.    Review of Systems See HPI   HISTORY: Past Medical, Surgical, Social, and Family History Reviewed & Updated per EMR.   Pertinent Historical Findings include:  Past Medical History:  Diagnosis Date   Allergy    Chicken pox    Hypertension    Kidney stones     Past Surgical History:  Procedure Laterality Date   KNEE SURGERY       PHYSICAL EXAM:  VS: BP 124/80   Ht 5' 11.5" (1.816 m)   Wt 265 lb (120.2 kg)   BMI 36.44 kg/m  Physical Exam Gen: NAD, alert, cooperative with exam, well-appearing MSK:  Neurovascularly intact       ASSESSMENT & PLAN:   Patellar subluxation, left, subsequent encounter Still having pain.  He is likely compensating for the right hip on the left knee. -Counseled on home exercise therapy and supportive care. -Could consider injection or physical therapy.  Primary osteoarthritis of right hip Still having significant pain in the hip.  Does get relief when he gets off of the seat. -He is scheduled for surgery

## 2022-04-16 NOTE — Assessment & Plan Note (Signed)
Still having significant pain in the hip.  Does get relief when he gets off of the seat. -He is scheduled for surgery

## 2022-04-16 NOTE — Assessment & Plan Note (Signed)
Still having pain.  He is likely compensating for the right hip on the left knee. -Counseled on home exercise therapy and supportive care. -Could consider injection or physical therapy.

## 2022-04-18 DIAGNOSIS — M25562 Pain in left knee: Secondary | ICD-10-CM | POA: Diagnosis not present

## 2022-04-18 DIAGNOSIS — R739 Hyperglycemia, unspecified: Secondary | ICD-10-CM | POA: Diagnosis not present

## 2022-04-18 DIAGNOSIS — R002 Palpitations: Secondary | ICD-10-CM | POA: Diagnosis not present

## 2022-04-18 DIAGNOSIS — Z125 Encounter for screening for malignant neoplasm of prostate: Secondary | ICD-10-CM | POA: Diagnosis not present

## 2022-04-18 DIAGNOSIS — Z13220 Encounter for screening for lipoid disorders: Secondary | ICD-10-CM | POA: Diagnosis not present

## 2022-04-18 DIAGNOSIS — M25551 Pain in right hip: Secondary | ICD-10-CM | POA: Diagnosis not present

## 2022-04-18 DIAGNOSIS — I10 Essential (primary) hypertension: Secondary | ICD-10-CM | POA: Diagnosis not present

## 2022-04-26 DIAGNOSIS — M1611 Unilateral primary osteoarthritis, right hip: Secondary | ICD-10-CM | POA: Diagnosis not present

## 2022-04-26 DIAGNOSIS — R262 Difficulty in walking, not elsewhere classified: Secondary | ICD-10-CM | POA: Diagnosis not present

## 2022-04-26 DIAGNOSIS — M25651 Stiffness of right hip, not elsewhere classified: Secondary | ICD-10-CM | POA: Diagnosis not present

## 2022-05-02 DIAGNOSIS — M1611 Unilateral primary osteoarthritis, right hip: Secondary | ICD-10-CM | POA: Diagnosis not present

## 2022-05-11 DIAGNOSIS — M1611 Unilateral primary osteoarthritis, right hip: Secondary | ICD-10-CM | POA: Diagnosis not present

## 2022-05-11 HISTORY — PX: TOTAL HIP ARTHROPLASTY: SHX124

## 2022-05-15 DIAGNOSIS — Z96641 Presence of right artificial hip joint: Secondary | ICD-10-CM | POA: Diagnosis not present

## 2022-05-15 DIAGNOSIS — M25651 Stiffness of right hip, not elsewhere classified: Secondary | ICD-10-CM | POA: Diagnosis not present

## 2022-05-15 DIAGNOSIS — M6281 Muscle weakness (generalized): Secondary | ICD-10-CM | POA: Diagnosis not present

## 2022-05-24 DIAGNOSIS — M6281 Muscle weakness (generalized): Secondary | ICD-10-CM | POA: Diagnosis not present

## 2022-05-24 DIAGNOSIS — M25651 Stiffness of right hip, not elsewhere classified: Secondary | ICD-10-CM | POA: Diagnosis not present

## 2022-05-24 DIAGNOSIS — Z96641 Presence of right artificial hip joint: Secondary | ICD-10-CM | POA: Diagnosis not present

## 2022-05-29 DIAGNOSIS — M6281 Muscle weakness (generalized): Secondary | ICD-10-CM | POA: Diagnosis not present

## 2022-05-29 DIAGNOSIS — Z96641 Presence of right artificial hip joint: Secondary | ICD-10-CM | POA: Diagnosis not present

## 2022-05-29 DIAGNOSIS — M25651 Stiffness of right hip, not elsewhere classified: Secondary | ICD-10-CM | POA: Diagnosis not present

## 2022-07-04 ENCOUNTER — Other Ambulatory Visit: Payer: Self-pay | Admitting: Nurse Practitioner

## 2022-07-04 DIAGNOSIS — J301 Allergic rhinitis due to pollen: Secondary | ICD-10-CM

## 2022-07-19 DIAGNOSIS — Z0000 Encounter for general adult medical examination without abnormal findings: Secondary | ICD-10-CM | POA: Diagnosis not present

## 2022-07-19 DIAGNOSIS — R809 Proteinuria, unspecified: Secondary | ICD-10-CM | POA: Diagnosis not present

## 2022-09-06 ENCOUNTER — Emergency Department (HOSPITAL_BASED_OUTPATIENT_CLINIC_OR_DEPARTMENT_OTHER): Payer: Self-pay

## 2022-09-06 ENCOUNTER — Encounter (HOSPITAL_BASED_OUTPATIENT_CLINIC_OR_DEPARTMENT_OTHER): Payer: Self-pay | Admitting: Emergency Medicine

## 2022-09-06 ENCOUNTER — Emergency Department (HOSPITAL_BASED_OUTPATIENT_CLINIC_OR_DEPARTMENT_OTHER)
Admission: EM | Admit: 2022-09-06 | Discharge: 2022-09-07 | Disposition: A | Discharge status: Home / Self Care | Payer: Self-pay | Attending: Emergency Medicine | Admitting: Emergency Medicine

## 2022-09-06 DIAGNOSIS — B9689 Other specified bacterial agents as the cause of diseases classified elsewhere: Secondary | ICD-10-CM

## 2022-09-06 DIAGNOSIS — R051 Acute cough: Secondary | ICD-10-CM

## 2022-09-06 DIAGNOSIS — Z79899 Other long term (current) drug therapy: Secondary | ICD-10-CM | POA: Insufficient documentation

## 2022-09-06 DIAGNOSIS — R5383 Other fatigue: Secondary | ICD-10-CM | POA: Insufficient documentation

## 2022-09-06 DIAGNOSIS — J019 Acute sinusitis, unspecified: Secondary | ICD-10-CM | POA: Insufficient documentation

## 2022-09-06 DIAGNOSIS — I10 Essential (primary) hypertension: Secondary | ICD-10-CM | POA: Insufficient documentation

## 2022-09-06 DIAGNOSIS — Z1152 Encounter for screening for COVID-19: Secondary | ICD-10-CM | POA: Insufficient documentation

## 2022-09-06 LAB — CBC
HCT: 42.9 % (ref 39.0–52.0)
Hemoglobin: 14 g/dL (ref 13.0–17.0)
MCH: 30.8 pg (ref 26.0–34.0)
MCHC: 32.6 g/dL (ref 30.0–36.0)
MCV: 94.3 fL (ref 80.0–100.0)
Platelets: 255 10*3/uL (ref 150–400)
RBC: 4.55 MIL/uL (ref 4.22–5.81)
RDW: 13.6 % (ref 11.5–15.5)
WBC: 10.6 10*3/uL — ABNORMAL HIGH (ref 4.0–10.5)
nRBC: 0 % (ref 0.0–0.2)

## 2022-09-06 LAB — BASIC METABOLIC PANEL
Anion gap: 9 (ref 5–15)
BUN: 15 mg/dL (ref 6–20)
CO2: 27 mmol/L (ref 22–32)
Calcium: 9.1 mg/dL (ref 8.9–10.3)
Chloride: 100 mmol/L (ref 98–111)
Creatinine, Ser: 1.16 mg/dL (ref 0.61–1.24)
GFR, Estimated: >60 mL/min (ref >60)
Glucose, Bld: 102 mg/dL — ABNORMAL HIGH (ref 70–99)
Potassium: 4.3 mmol/L (ref 3.5–5.1)
Sodium: 136 mmol/L (ref 135–145)

## 2022-09-06 LAB — RESP PANEL BY RT-PCR (FLU A&B, COVID) ARPGX2
Influenza A by PCR: NEGATIVE
Influenza B by PCR: NEGATIVE
SARS Coronavirus 2 by RT PCR: NEGATIVE

## 2022-09-06 LAB — TROPONIN I (HIGH SENSITIVITY)
Troponin I (High Sensitivity): 2 ng/L (ref <18)
Troponin I (High Sensitivity): 2 ng/L (ref <18)

## 2022-09-06 MED ORDER — ACETAMINOPHEN 500 MG PO TABS
1000.0000 mg | ORAL_TABLET | Freq: Once | ORAL | Status: AC
  Administered 2022-09-06: 1000 mg via ORAL

## 2022-09-06 MED ORDER — ACETAMINOPHEN 500 MG PO TABS
ORAL_TABLET | ORAL | Status: AC
  Filled 2022-09-06: qty 2

## 2022-09-06 MED ORDER — DOXYCYCLINE HYCLATE 100 MG PO CAPS: 100.0000 mg | ORAL_CAPSULE | Freq: Two times a day (BID) | ORAL | 0 refills | Status: AC

## 2022-09-06 MED ORDER — FLUTICASONE PROPIONATE 50 MCG/ACT NA SUSP: 2.0000 | Freq: Every day | NASAL | 0 refills | Status: DC

## 2022-09-06 MED ORDER — DOXYCYCLINE HYCLATE 100 MG PO CAPS: 100.0000 mg | ORAL_CAPSULE | Freq: Two times a day (BID) | ORAL | 0 refills | Status: DC

## 2022-09-06 MED ORDER — BENZONATATE 100 MG PO CAPS: 100.0000 mg | ORAL_CAPSULE | Freq: Three times a day (TID) | ORAL | 0 refills | Status: DC

## 2022-09-06 MED ORDER — LACTATED RINGERS IV BOLUS
1000.0000 mL | Freq: Once | INTRAVENOUS | Status: AC
  Administered 2022-09-06: 1000 mL via INTRAVENOUS

## 2022-09-06 MED ORDER — KETOROLAC TROMETHAMINE 15 MG/ML IJ SOLN
15.0000 mg | Freq: Once | INTRAMUSCULAR | Status: AC
  Administered 2022-09-06: 15 mg via INTRAVENOUS
  Filled 2022-09-06: qty 1

## 2022-09-06 MED ORDER — DOXYCYCLINE HYCLATE 100 MG PO TABS
100.0000 mg | ORAL_TABLET | Freq: Once | ORAL | Status: AC
  Administered 2022-09-06: 100 mg via ORAL
  Filled 2022-09-06: qty 1

## 2022-09-06 NOTE — ED Triage Notes (Signed)
Cough, fatigue, constant central chest pain, sore throat and "sinus issues" that worsened yesterday. Sx have been ongoing for 2 weeks.

## 2022-09-06 NOTE — Discharge Instructions (Addendum)
Today you were seen in the emergency department for your cough and sinus infection.    In the emergency department you had a chest x-ray that was reassuring.  You were given fluids and antibiotics.    At home, please take Tylenol and Motrin for your pain.  Take the doxycycline (antibiotics) for your infection and use Flonase for your congestion and Tessalon Perles for your cough.  You may also use tea with honey for your cough.    Follow-up with your primary doctor in 2-3 days regarding your visit.    Return immediately to the emergency department if you experience any of the following: Severe chest pain, difficulty breathing, or any other concerning symptoms.    Thank you for visiting our Emergency Department. It was a pleasure taking care of you today.

## 2022-09-06 NOTE — ED Provider Notes (Signed)
[facility] EMERGENCY DEPARTMENT Provider Note   CSN: [id number] Arrival date & time: 09/06/22  1830     History  Chief Complaint  Patient presents with   Cough   Fatigue    Joel [name] is a 51 y.o. male.  51 year old male with a history of seasonal allergies presents to the emergency department with facial pressure, congestion, and cough.  Patient states that for the past 2 weeks he has had increased sinus pressure and congestion.  Says that he has been coughing frequently because of this and has had hot and cold chills.  States that he has tried Mucinex at home without any improvement.  Denies any significant shortness of breath.  Says that he has chest discomfort from the coughing but denies chest discomfort otherwise.  No history of DVT/PE or new lower extremity swelling.  No surgery in the past month.  No history of cancer.   Past Medical History:  Diagnosis Date   Allergy    Chicken pox    Hypertension    Kidney stones       Home Medications Prior to Admission medications   Medication Sig Start Date End Date Taking? Authorizing Provider  benzonatate (TESSALON) 100 MG capsule Take 1 capsule (100 mg total) by mouth every 8 (eight) hours. 09/06/22   Rondel Baton, MD  cetirizine (ZYRTEC) 10 MG tablet TAKE 1 TABLET BY MOUTH ONCE DAILY 07/04/22   Ivonne Andrew, NP  doxycycline (VIBRAMYCIN) 100 MG capsule Take 1 capsule (100 mg total) by mouth 2 (two) times daily for 7 days. 09/06/22 09/13/22  Rondel Baton, MD  fluticasone (FLONASE) 50 MCG/ACT nasal spray Place 2 sprays into both nostrils daily. 09/06/22   Rondel Baton, MD  HYDROcodone-acetaminophen (NORCO/VICODIN) 5-325 MG tablet Take 1 tablet by mouth every 6 (six) hours as needed. 04/06/22   Hudnall, Azucena Fallen, MD  ibuprofen (ADVIL) 800 MG tablet Take 1 tablet (800 mg total) by mouth 3 (three) times daily. 03/07/22   Cristopher Peru, PA-C  ondansetron (ZOFRAN) 4 MG tablet Take 1 tablet (4 mg total)  by mouth every 8 (eight) hours as needed for nausea or vomiting. 04/04/22   Hudnall, Azucena Fallen, MD  predniSONE (DELTASONE) 5 MG tablet Take 6 pills for first day, 5 pills second day, 4 pills third day, 3 pills fourth day, 2 pills the fifth day, and 1 pill sixth day. 03/22/22   Myra Rude, MD      Allergies    Penicillins    Review of Systems   Review of Systems  Physical Exam Updated Vital Signs BP 117/74   Pulse 90   Temp 99 F (37.2 C) (Oral)   Resp 18   Ht 6' (1.829 m)   Wt 116.6 kg   SpO2 96%   BMI 34.86 kg/m  Physical Exam Vitals and nursing note reviewed.  Constitutional:      General: He is not in acute distress.    Appearance: He is well-developed.     Comments: Coughing frequently during exam  HENT:     Head: Normocephalic and atraumatic.     Comments: Significant tenderness to palpation of the frontal and maxillary sinuses    Right Ear: External ear normal.     Left Ear: External ear normal.     Nose: Congestion present.     Mouth/Throat:     Mouth: Mucous membranes are moist.     Pharynx: Oropharynx is clear.  Eyes:  Extraocular Movements: Extraocular movements intact.     Conjunctiva/sclera: Conjunctivae normal.     Pupils: Pupils are equal, round, and reactive to light.  Cardiovascular:     Rate and Rhythm: Normal rate and regular rhythm.     Heart sounds: Normal heart sounds.  Pulmonary:     Effort: Pulmonary effort is normal. No respiratory distress.     Breath sounds: Normal breath sounds.  Abdominal:     General: There is no distension.     Palpations: Abdomen is soft. There is no mass.     Tenderness: There is no abdominal tenderness. There is no guarding.  Musculoskeletal:        General: No swelling.     Cervical back: Normal range of motion and neck supple.     Right lower leg: No edema.     Left lower leg: No edema.  Skin:    General: Skin is warm and dry.     Capillary Refill: Capillary refill takes less than 2 seconds.   Neurological:     Mental Status: He is alert. Mental status is at baseline.  Psychiatric:        Mood and Affect: Mood normal.        Behavior: Behavior normal.     ED Results / Procedures / Treatments   Labs (all labs ordered are listed, but only abnormal results are displayed) Labs Reviewed  BASIC METABOLIC PANEL - Abnormal; Notable for the following components:      Result Value   Glucose, Bld 102 (*)    All other components within normal limits  CBC - Abnormal; Notable for the following components:   WBC 10.6 (*)    All other components within normal limits  RESP PANEL BY RT-PCR (FLU A&B, COVID) ARPGX2  TROPONIN I (HIGH SENSITIVITY)  TROPONIN I (HIGH SENSITIVITY)    EKG EKG Interpretation  Date/Time:  Thursday September 06 2022 18:41:37 EDT Ventricular Rate:  129 PR Interval:  124 QRS Duration: 80 QT Interval:  284 QTC Calculation: 416 R Axis:   -2 Text Interpretation: Sinus tachycardia Otherwise normal ECG No previous ECGs available Confirmed by Margaretmary Eddy [phone]) on 09/06/2022 8:16:50 PM  Radiology DG Chest 2 View  Result Date: 09/06/2022 CLINICAL DATA:  Cough and chest pain.  Fatigue. EXAM: CHEST - 2 VIEW COMPARISON:  None Available. FINDINGS: The heart size and mediastinal contours are within normal limits. Both lungs are clear. The visualized skeletal structures are unremarkable. IMPRESSION: No active cardiopulmonary disease. Electronically Signed   By: Van Clines M.D.   On: 09/06/2022 19:11    Procedures Procedures   Medications Ordered in ED Medications  acetaminophen (TYLENOL) tablet 1,000 mg (1,000 mg Oral Given 09/06/22 1849)  lactated ringers bolus 1,000 mL (0 mLs Intravenous Stopped 09/06/22 2148)  ketorolac (TORADOL) 15 MG/ML injection 15 mg (15 mg Intravenous Given 09/06/22 2131)  doxycycline (VIBRA-TABS) tablet 100 mg (100 mg Oral Given 09/06/22 2147)    ED Course/ Medical Decision Making/ A&P                           Medical  Decision Making Amount and/or Complexity of Data Reviewed Labs: ordered. Radiology: ordered.  Risk OTC drugs. Prescription drug management.   Joel [name] is a 51 y.o. male with comorbidities that complicate the patient evaluation including seasonal allergies who presents with chief complaint of sinus pressure, congestion, cough, and subjective fever.  This patient presents to the  ED for concern of complaints listed in HPI, this involves an extensive number of treatment options, and is a complaint that carries with it a high risk of complications and morbidity. Disposition including potential need for admission considered.   Initial Ddx:  Bacterial sinusitis, viral sinusitis, pneumonia, URI, seasonal allergies, PE, MI  MDM:  Feel the patient likely has sinusitis given his symptoms.  With the prolonged duration and severity of his symptoms are concern for possible bacterial sinusitis.  We will also obtain chest x-ray and COVID and flu to evaluate for pneumonia and URI.  Chest pain appears to be associated just with coughing so feel that PE and MI highly unlikely.  Plan:  Labs Chest x-ray EKG COVID/flu  ED Summary/Re-evaluation:  Patient given Tylenol and fluids with improvement of his heart rate.  Was reevaluated and reported that his chest pain with coughing had improved with the Toradol.  Gave the patient a dose of doxycycline in the emergency department for his sinusitis since that his lasted over 10 days and he is having severe symptoms but has a PCN allergy.  Chest x-ray, COVID, and influenza test did not reveal signs of other infection.  EKG and serial troponins were WNL.  Instructed patient on symptomatic care of his sinusitis and gave him her prescription for Tessalon Perles, Afrin, and doxycycline to take at home.  We will have him follow-up with his primary doctor in 2 to 3 days.  Dispo: DC Home. Return precautions discussed including, but not limited to, those listed in  the AVS. Allowed pt time to ask questions which were answered fully prior to dc.   Additional history obtained from spouse Records reviewed Outpatient Clinic Notes The following labs were independently interpreted: Serial Troponins and show no acute abnormality I independently reviewed the following imaging with scope of interpretation limited to determining acute life threatening conditions related to emergency care: Chest x-ray, which revealed no acute abnormality  I personally reviewed and interpreted cardiac monitoring: normal sinus rhythm  and sinus tachycardia I personally reviewed and interpreted the pt's EKG: see above for interpretation  I have reviewed the patients home medications and made adjustments as needed  Final Clinical Impression(s) / ED Diagnoses Final diagnoses:  Acute bacterial sinusitis  Acute cough    Rx / DC Orders ED Discharge Orders          Ordered    doxycycline (VIBRAMYCIN) 100 MG capsule  2 times daily,   Status:  Discontinued        09/06/22 2136    fluticasone (FLONASE) 50 MCG/ACT nasal spray  Daily,   Status:  Discontinued        09/06/22 2136    benzonatate (TESSALON) 100 MG capsule  Every 8 hours,   Status:  Discontinued        09/06/22 2321    benzonatate (TESSALON) 100 MG capsule  Every 8 hours        09/06/22 2334    doxycycline (VIBRAMYCIN) 100 MG capsule  2 times daily        09/06/22 2334    fluticasone (FLONASE) 50 MCG/ACT nasal spray  Daily        09/06/22 2334              Rondel Baton, MD 09/07/22 1018

## 2023-01-06 IMAGING — DX DG KNEE COMPLETE 4+V*L*
4 series · 4 of 4 positions shown · non-contrast
Comparison: None Available.

CLINICAL DATA: Pain

EXAM:
LEFT KNEE - COMPLETE 4+ VIEW

[knee lat]
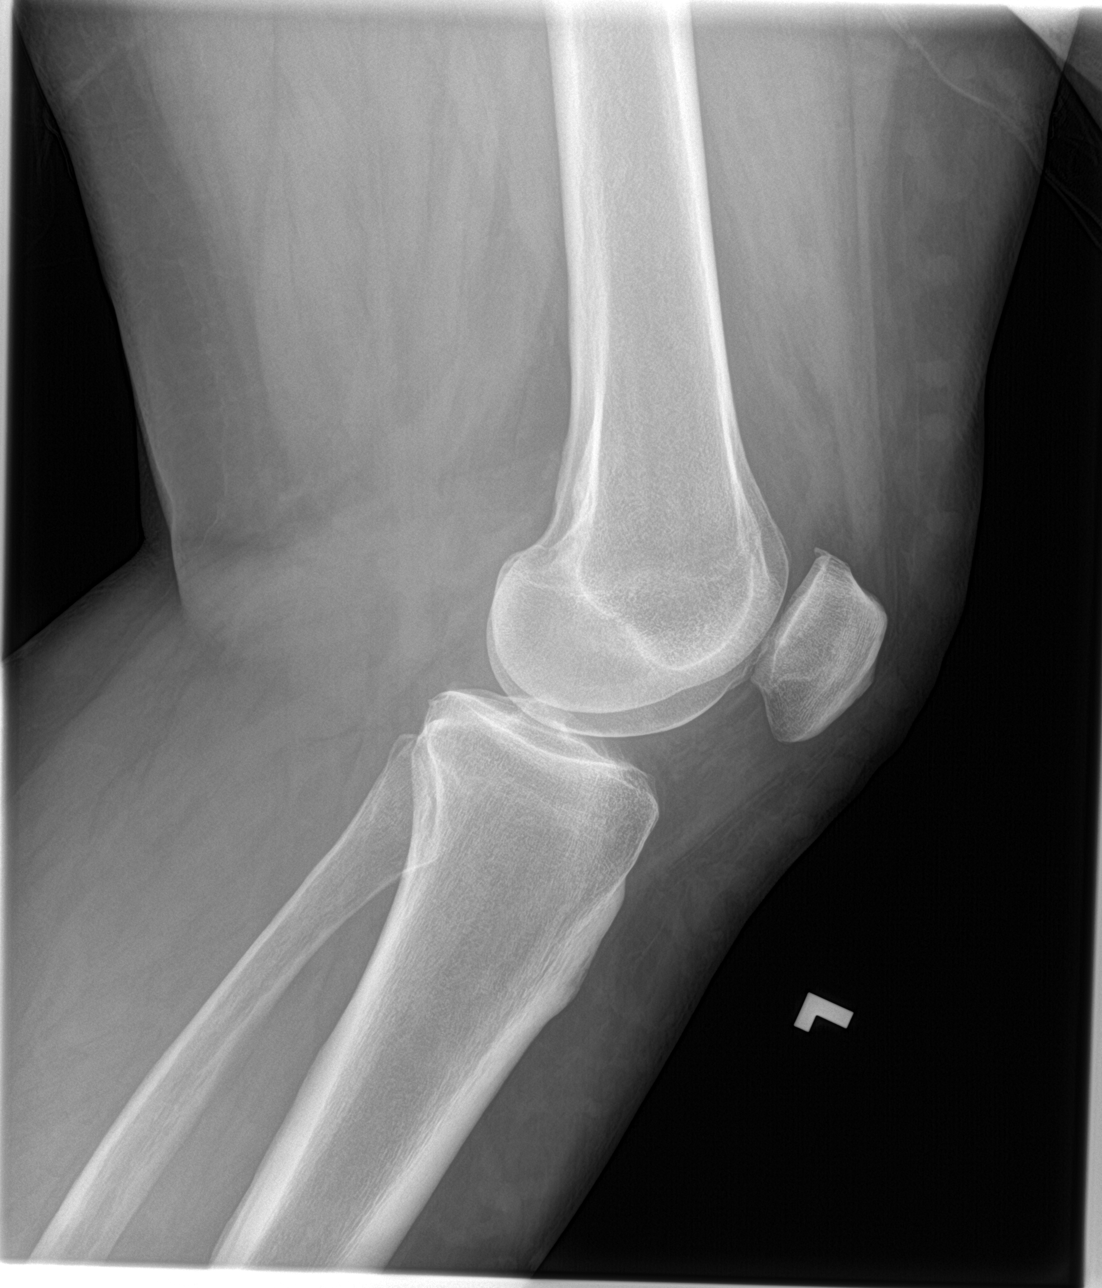

[knee obl (1 of 2)]
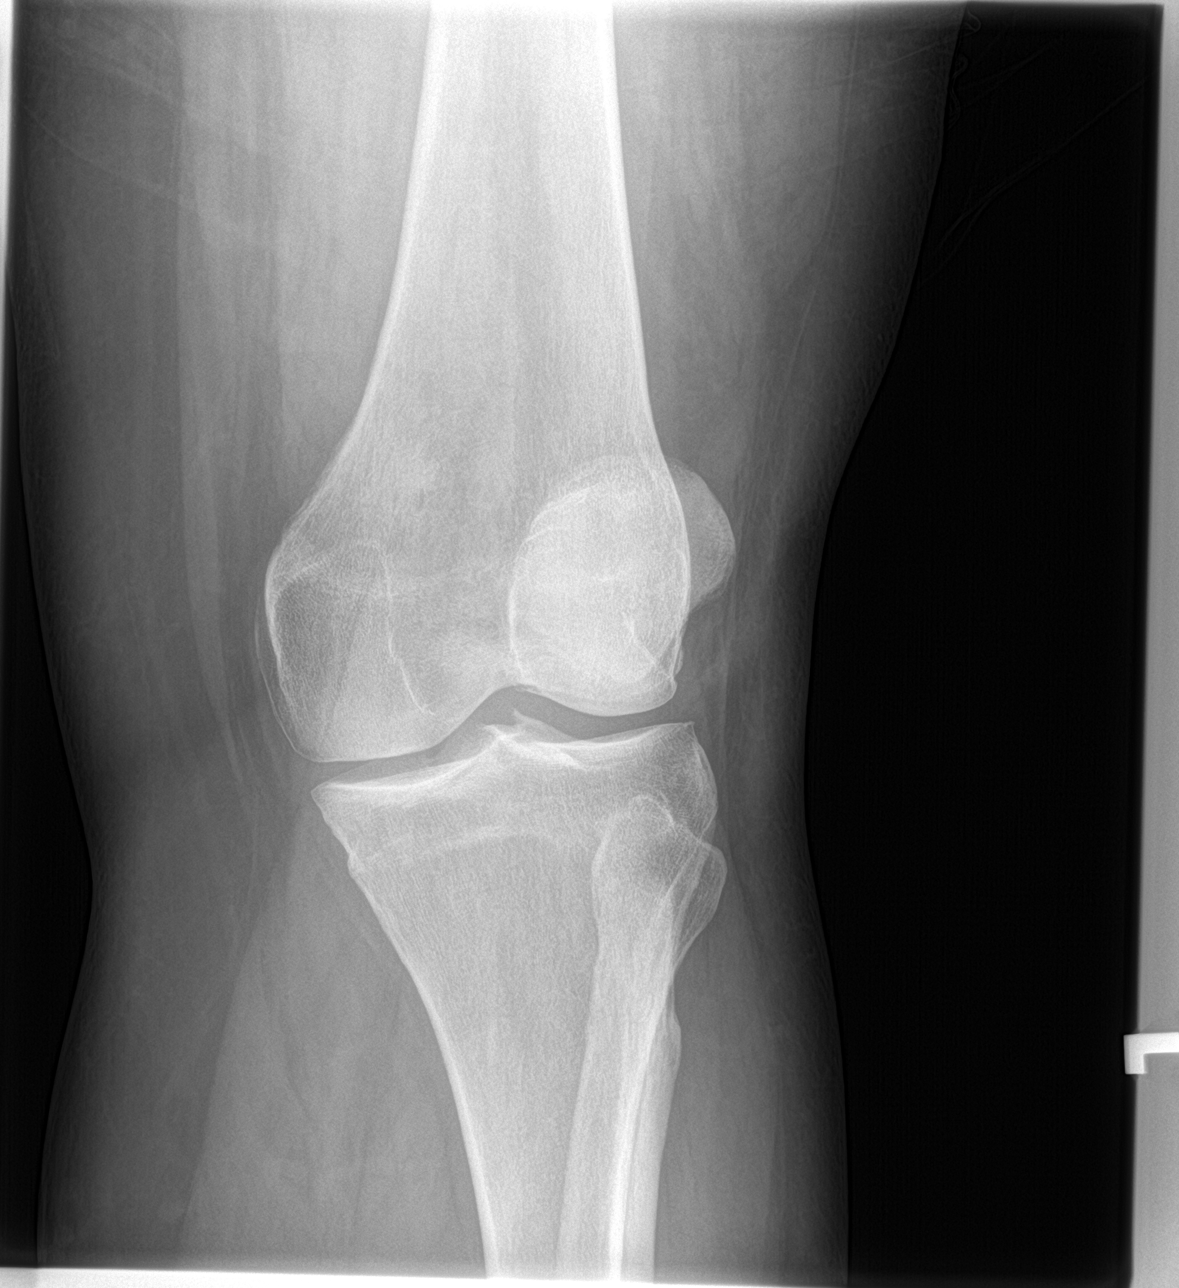

[knee ap]
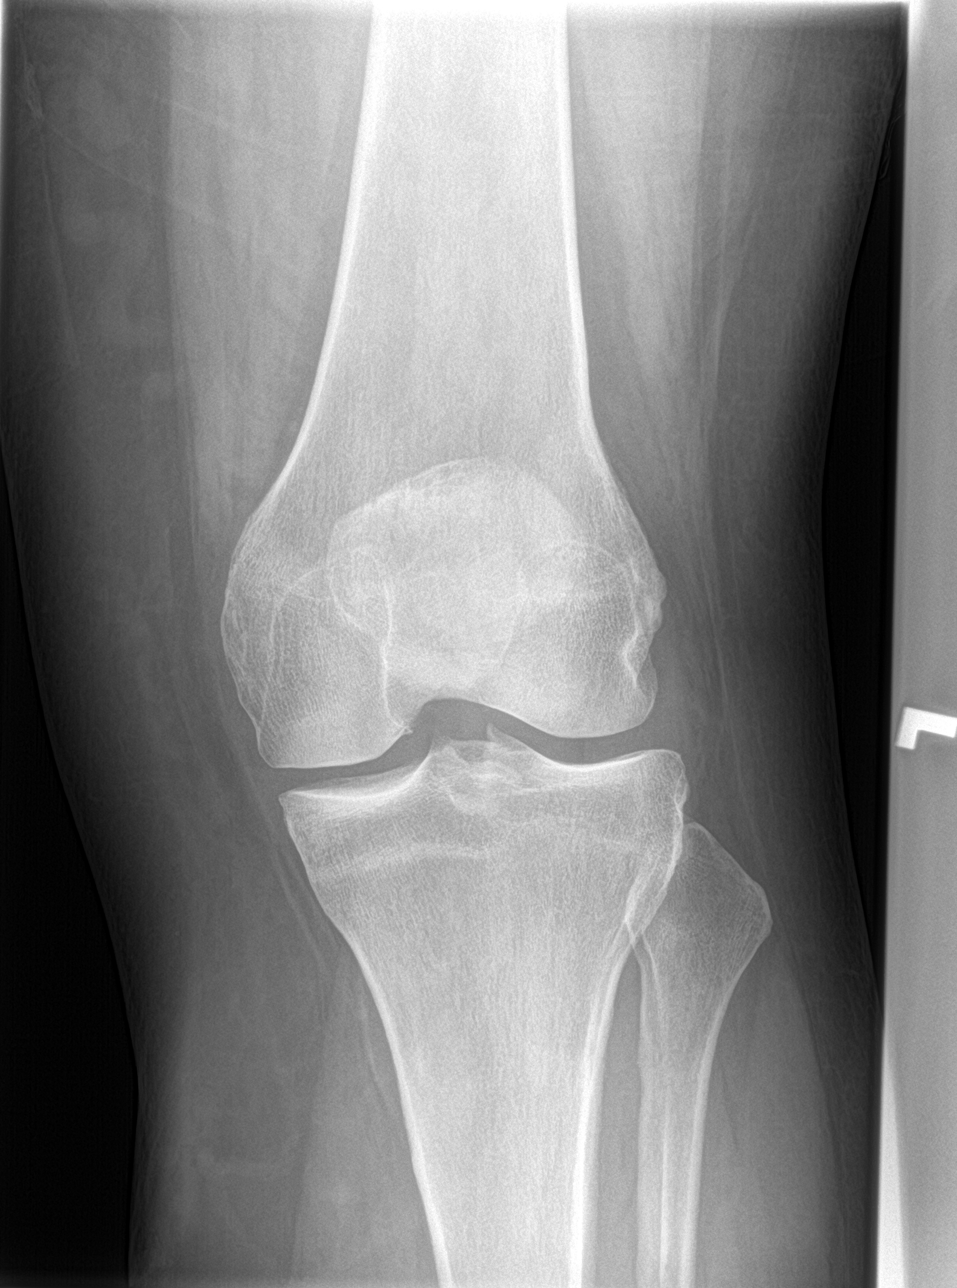

[knee obl (2 of 2)]
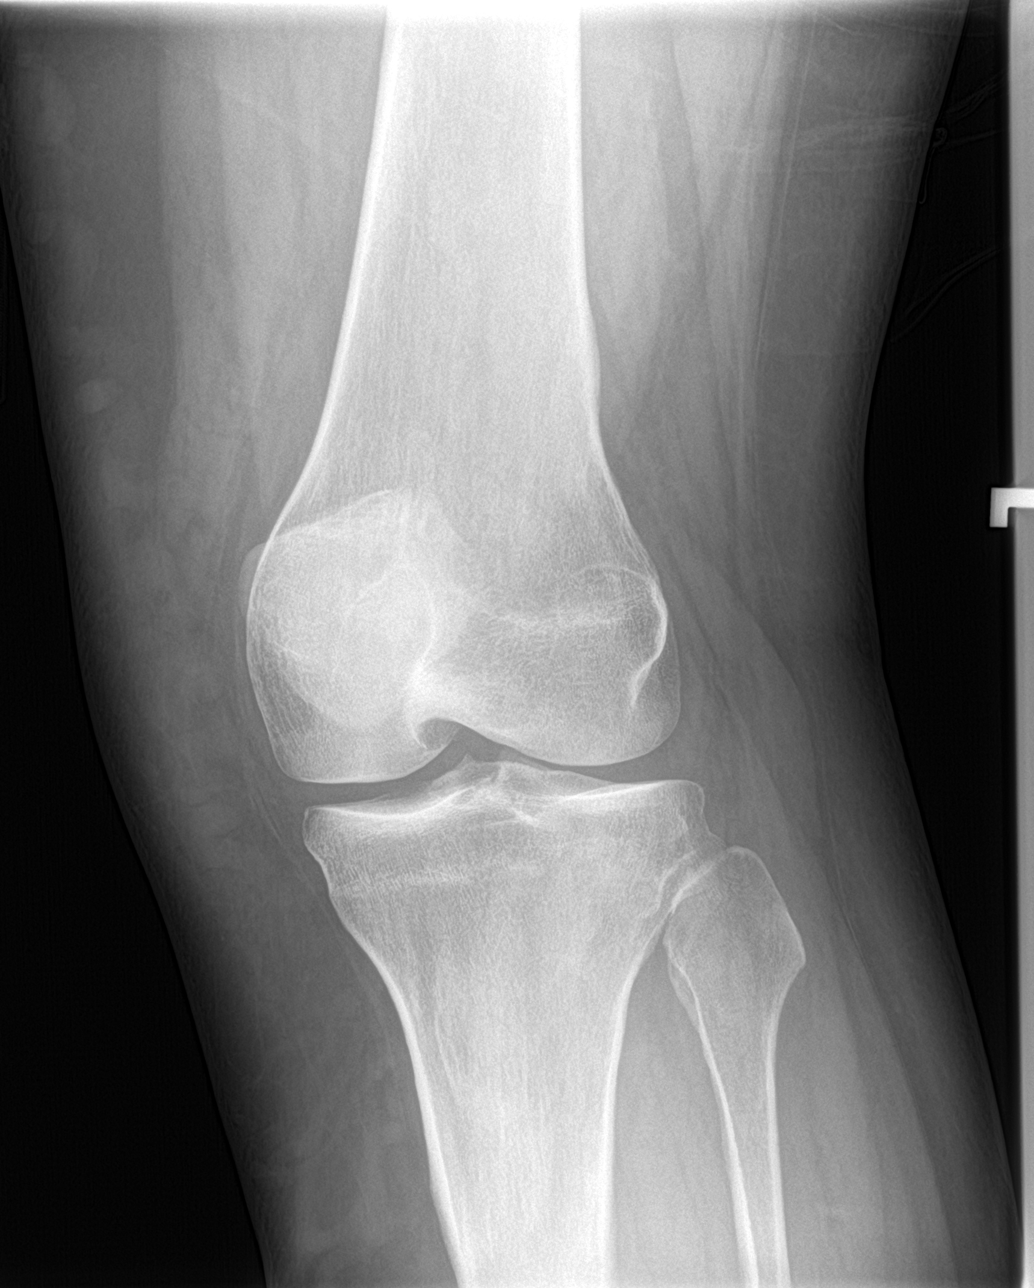

[4 of 4 positions shown; findings below may reference images not displayed]

FINDINGS: No recent fracture or dislocation is seen. There is slight
prominence of suprapatellar bursa suggesting small effusion. Minimal
bony spurs seen.
IMPRESSION: No recent fracture or dislocation is seen in the left knee. Possible
small effusion is present in the suprapatellar bursa. Degenerative
changes are noted with minimal bony spurs.

## 2023-01-17 ENCOUNTER — Other Ambulatory Visit: Payer: Self-pay | Admitting: Internal Medicine

## 2023-01-17 ENCOUNTER — Ambulatory Visit
Admission: RE | Admit: 2023-01-17 | Discharge: 2023-01-17 | Disposition: A | Discharge status: Home / Self Care | Payer: 59 | Source: Ambulatory Visit | Attending: Internal Medicine | Admitting: Internal Medicine

## 2023-01-17 DIAGNOSIS — M25511 Pain in right shoulder: Secondary | ICD-10-CM

## 2023-02-18 ENCOUNTER — Encounter: Payer: Self-pay | Admitting: *Deleted

## 2023-11-10 ENCOUNTER — Emergency Department (HOSPITAL_BASED_OUTPATIENT_CLINIC_OR_DEPARTMENT_OTHER)
Admission: EM | Admit: 2023-11-10 | Discharge: 2023-11-11 | Disposition: A | Discharge status: Home / Self Care | Payer: 59 | Attending: Emergency Medicine | Admitting: Emergency Medicine

## 2023-11-10 ENCOUNTER — Other Ambulatory Visit: Payer: Self-pay

## 2023-11-10 DIAGNOSIS — R519 Headache, unspecified: Secondary | ICD-10-CM | POA: Diagnosis present

## 2023-11-10 DIAGNOSIS — F17220 Nicotine dependence, chewing tobacco, uncomplicated: Secondary | ICD-10-CM | POA: Insufficient documentation

## 2023-11-10 DIAGNOSIS — B349 Viral infection, unspecified: Secondary | ICD-10-CM | POA: Diagnosis not present

## 2023-11-10 DIAGNOSIS — Z96641 Presence of right artificial hip joint: Secondary | ICD-10-CM | POA: Insufficient documentation

## 2023-11-10 DIAGNOSIS — I10 Essential (primary) hypertension: Secondary | ICD-10-CM | POA: Insufficient documentation

## 2023-11-10 DIAGNOSIS — R7309 Other abnormal glucose: Secondary | ICD-10-CM | POA: Diagnosis not present

## 2023-11-10 DIAGNOSIS — Z20822 Contact with and (suspected) exposure to covid-19: Secondary | ICD-10-CM | POA: Insufficient documentation

## 2023-11-10 NOTE — ED Triage Notes (Addendum)
 Lightheadedness, subjective fever, dizziness, nausea, vomited phlem. Denies abd pain, diarrhea. Temp on arrival 98.8. Coworkers with similar sx tested positive for flu. Body aches, and difficulty sleeping also. Pt states kinda had it all week but worse today. Dizziness started Friday and happens 8-10 times a day.

## 2023-11-11 ENCOUNTER — Encounter (HOSPITAL_BASED_OUTPATIENT_CLINIC_OR_DEPARTMENT_OTHER): Payer: Self-pay | Admitting: Emergency Medicine

## 2023-11-11 ENCOUNTER — Emergency Department (HOSPITAL_BASED_OUTPATIENT_CLINIC_OR_DEPARTMENT_OTHER): Payer: 59

## 2023-11-11 ENCOUNTER — Other Ambulatory Visit: Payer: Self-pay

## 2023-11-11 LAB — CBC
HCT: 42.1 % (ref 39.0–52.0)
Hemoglobin: 14.2 g/dL (ref 13.0–17.0)
MCH: 31.3 pg (ref 26.0–34.0)
MCHC: 33.7 g/dL (ref 30.0–36.0)
MCV: 92.9 fL (ref 80.0–100.0)
Platelets: 255 10*3/uL (ref 150–400)
RBC: 4.53 MIL/uL (ref 4.22–5.81)
RDW: 13 % (ref 11.5–15.5)
WBC: 11.5 10*3/uL — ABNORMAL HIGH (ref 4.0–10.5)
nRBC: 0 % (ref 0.0–0.2)

## 2023-11-11 LAB — COMPREHENSIVE METABOLIC PANEL
ALT: 14 U/L (ref 0–44)
AST: 19 U/L (ref 15–41)
Albumin: 3.8 g/dL (ref 3.5–5.0)
Alkaline Phosphatase: 80 U/L (ref 38–126)
Anion gap: 8 (ref 5–15)
BUN: 17 mg/dL (ref 6–20)
CO2: 26 mmol/L (ref 22–32)
Calcium: 8.9 mg/dL (ref 8.9–10.3)
Chloride: 103 mmol/L (ref 98–111)
Creatinine, Ser: 1.16 mg/dL (ref 0.61–1.24)
GFR, Estimated: >60 mL/min (ref >60)
Glucose, Bld: 83 mg/dL (ref 70–99)
Potassium: 4.1 mmol/L (ref 3.5–5.1)
Sodium: 137 mmol/L (ref 135–145)
Total Bilirubin: 0.2 mg/dL (ref 0.0–1.2)
Total Protein: 8.2 g/dL — ABNORMAL HIGH (ref 6.5–8.1)

## 2023-11-11 LAB — RESP PANEL BY RT-PCR (RSV, FLU A&B, COVID)  RVPGX2
Influenza A by PCR: NEGATIVE
Influenza B by PCR: NEGATIVE
Resp Syncytial Virus by PCR: NEGATIVE
SARS Coronavirus 2 by RT PCR: NEGATIVE

## 2023-11-11 LAB — TROPONIN I (HIGH SENSITIVITY)
Troponin I (High Sensitivity): 3 ng/L (ref <18)
Troponin I (High Sensitivity): 3 ng/L (ref <18)

## 2023-11-11 LAB — CBG MONITORING, ED: Glucose-Capillary: 102 mg/dL — ABNORMAL HIGH (ref 70–99)

## 2023-11-11 MED ORDER — DIPHENHYDRAMINE HCL 50 MG/ML IJ SOLN
50.0000 mg | Freq: Once | INTRAMUSCULAR | Status: AC
  Administered 2023-11-11: 50 mg via INTRAVENOUS
  Filled 2023-11-11: qty 1

## 2023-11-11 MED ORDER — DIPHENHYDRAMINE HCL 50 MG/ML IJ SOLN
25.0000 mg | Freq: Once | INTRAMUSCULAR | Status: AC
  Administered 2023-11-11: 25 mg via INTRAVENOUS
  Filled 2023-11-11: qty 1

## 2023-11-11 MED ORDER — IBUPROFEN 800 MG PO TABS
800.0000 mg | ORAL_TABLET | Freq: Once | ORAL | Status: AC
  Administered 2023-11-11: 800 mg via ORAL
  Filled 2023-11-11: qty 1

## 2023-11-11 MED ORDER — SODIUM CHLORIDE 0.9 % IV BOLUS
1000.0000 mL | Freq: Once | INTRAVENOUS | Status: AC
  Administered 2023-11-11: 1000 mL via INTRAVENOUS

## 2023-11-11 MED ORDER — PROCHLORPERAZINE EDISYLATE 10 MG/2ML IJ SOLN
10.0000 mg | Freq: Once | INTRAMUSCULAR | Status: AC
  Administered 2023-11-11: 10 mg via INTRAVENOUS
  Filled 2023-11-11: qty 2

## 2023-11-11 MED ORDER — ONDANSETRON HCL 4 MG/2ML IJ SOLN
4.0000 mg | Freq: Once | INTRAMUSCULAR | Status: AC
  Administered 2023-11-11: 4 mg via INTRAVENOUS
  Filled 2023-11-11: qty 2

## 2023-11-11 MED ORDER — KETOROLAC TROMETHAMINE 15 MG/ML IJ SOLN: 15.0000 mg | Freq: Once | INTRAMUSCULAR | Status: DC

## 2023-11-11 NOTE — ED Provider Notes (Signed)
 [facility]EMERGENCY DEPT [facility] Emergency Department Provider Note MRN:  [id number]  Arrival date & time: 11/11/23     Chief Complaint   Influenza   History of Present Illness   Joel [name] is a 53 y.o. year-old male with a history of hypertension presenting to the ED with chief complaint of influenza.  Feeling sick for about a week.  Multiple coworkers recently tested positive for the flu.  Patient having headache, body aches, malaise, fatigue, weakness, minimal cough, no sore throat, no abdominal pain.  Nausea with occasional vomiting, no diarrhea.  Today is feeling dizzy when standing up from a seated position, feeling flutters in his chest on occasion, had some chest discomfort earlier today.  Review of Systems  A thorough review of systems was obtained and all systems are negative except as noted in the HPI and PMH.   Patient's Health History    Past Medical History:  Diagnosis Date   Allergy     Chicken pox    Hypertension    Kidney stones     Past Surgical History:  Procedure Laterality Date   KNEE SURGERY     TOTAL HIP ARTHROPLASTY Right 05/11/2022    Family History  Problem Relation Age of Onset   Heart attack Mother    Ovarian cancer Mother    Arthritis Mother    Leukemia Mother    Heart attack Father    Heart attack Maternal Grandmother    Arthritis Maternal Grandmother    Diabetes Maternal Grandmother    Hypertension Maternal Grandmother    Stroke Paternal Grandmother     Social History   Socioeconomic History   Marital status: Married    Spouse name: Not on file   Number of children: 0   Years of education: 12   Highest education level: Not on file  Occupational History   Occupation: Security  Tobacco Use   Smoking status: Never   Smokeless tobacco: Current    Types: Chew  Vaping Use   Vaping status: Never Used  Substance and Sexual Activity   Alcohol use: Yes    Comment: Rarely   Drug use: No   Sexual activity: Not  Currently  Other Topics Concern   Not on file  Social History Narrative   Born [place], [place] and raised in [place], [place]. Currently resides in an apartment. 2 dogs. Fun: Shoot   Denies religious beliefs that would effect health care.    Social Drivers of Corporate Investment Banker Strain: Not on file  Food Insecurity: Not on file  Transportation Needs: Not on file  Physical Activity: Not on file  Stress: Not on file  Social Connections: Not on file  Intimate Partner Violence: Not on file     Physical Exam   Vitals:   11/10/23 2358 11/11/23 0400  BP: 130/74 120/65  Pulse: 91 71  Resp: 20 (!) 21  Temp: 98.8 F (37.1 C)   SpO2: 98% 97%    CONSTITUTIONAL: Well-appearing, NAD NEURO/PSYCH:  Alert and oriented x 3, normal and symmetric strength and sensation, normal coordination, normal speech EYES:  eyes equal and reactive, normal extraocular movements, no nystagmus ENT/NECK:  no LAD, no JVD CARDIO: Regular rate, well-perfused, normal S1 and S2 PULM:  CTAB no wheezing or rhonchi GI/GU:  non-distended, non-tender MSK/SPINE:  No gross deformities, no edema SKIN:  no rash, atraumatic   *Additional and/or pertinent findings included in MDM below  Diagnostic and Interventional Summary    EKG Interpretation Date/Time:  Monday November 11 2023 00:18:01 EST Ventricular Rate:  93 PR Interval:  145 QRS Duration:  95 QT Interval:  336 QTC Calculation: 418 R Axis:   -1  Text Interpretation: Sinus rhythm Abnormal R-wave progression, early transition ST elevation, consider inferior injury Confirmed by Theadore Sharper [phone]) on 11/11/2023 12:31:00 AM       Labs Reviewed  CBC - Abnormal; Notable for the following components:      Result Value   WBC 11.5 (*)    All other components within normal limits  COMPREHENSIVE METABOLIC PANEL - Abnormal; Notable for the following components:   Total Protein 8.2 (*)    All other components within normal limits  CBG MONITORING, ED -  Abnormal; Notable for the following components:   Glucose-Capillary 102 (*)    All other components within normal limits  RESP PANEL BY RT-PCR (RSV, FLU A&B, COVID)  RVPGX2  TROPONIN I (HIGH SENSITIVITY)  TROPONIN I (HIGH SENSITIVITY)    CT HEAD WO CONTRAST ( )  Final Result    DG Chest 2 View  Final Result      Medications  ibuprofen  (ADVIL ) tablet 800 mg (800 mg Oral Given 11/11/23 0019)  sodium chloride  0.9 % bolus 1,000 mL (1,000 mLs Intravenous New Bag/Given 11/11/23 0106)  ondansetron  (ZOFRAN ) injection 4 mg (4 mg Intravenous Given 11/11/23 0058)  prochlorperazine  (COMPAZINE ) injection 10 mg (10 mg Intravenous Given 11/11/23 0348)  diphenhydrAMINE  (BENADRYL ) injection 25 mg (25 mg Intravenous Given 11/11/23 0348)  sodium chloride  0.9 % bolus 1,000 mL (1,000 mLs Intravenous New Bag/Given 11/11/23 0352)  diphenhydrAMINE  (BENADRYL ) injection 50 mg (50 mg Intravenous Given 11/11/23 0426)     Procedures  /  Critical Care .1-3 Lead EKG Interpretation  Performed by: Theadore Sharper HERO, MD Authorized by: Theadore Sharper HERO, MD     Interpretation: normal     ECG rate:  70s   Rhythm: sinus rhythm     Ectopy: none     Conduction: normal   Comments:     Cardiac monitoring was ordered to monitor the patient for dysrhythmia.  I personally interpreted the patient's cardiac monitor while at the bedside.     ED Course and Medical Decision Making  Initial Impression and Ddx Suspect viral illness with dehydration.  Other considerations include cardiac arrhythmia, ACS, electrolyte disturbance, acute kidney injury.  Patient is without tachycardia or hypoxia, no leg pain or swelling, no evidence of DVT, highly doubt PE.  Endorsing dizziness that sounds more orthostatic, no nystagmus, no neurological deficits, doubt emergent CNS process.  Past medical/surgical history that increases complexity of ED encounter: None  Interpretation of Diagnostics I personally reviewed the EKG and my interpretation is as  follows: Sinus rhythm  Labs reassuring with no significant blood count or electrolyte disturbance.  Troponin negative x 2.  CT head normal  Patient Reassessment and Ultimate Disposition/Management Clinical Course as of 11/11/23 0434  Mon Nov 11, 2023  0209 Chest tightness started over the past 2-3 hours, will need 2nd troponin.  Increased frequency and severity of headaches over the past 2-3 months, obtaining CT head to exclude mass occupying lesion. [MB]    Clinical Course User Index [MB] Theadore Sharper HERO, MD     Patient feeling better after migraine cocktail, continued normal vital signs and reassuring neurological exam, given symptoms favoring viral illness.  Appropriate for discharge with return precautions.  Patient management required discussion with the following services or consulting groups:  None  Complexity of Problems Addressed Acute illness  or injury that poses threat of life of bodily function  Additional Data Reviewed and Analyzed Further history obtained from: Further history from spouse/family member  Additional Factors Impacting ED Encounter Risk None  Ozell HERO. Theadore, MD [facility] Emergency Medicine [facility] [email or web address]  Final Clinical Impressions(s) / ED Diagnoses     ICD-10-CM   1. Viral illness  B34.9       ED Discharge Orders     None        Discharge Instructions Discussed with and Provided to Patient:     Discharge Instructions      You were evaluated in the Emergency Department and after careful evaluation, we did not find any emergent condition requiring admission or further testing in the hospital.  Your exam/testing today was overall reassuring.  Symptoms may be due to a viral illness.  Recommend continued rest, plenty of fluids, Tylenol  or Motrin  at home for discomfort.  Please return to the Emergency Department if you experience any worsening of your condition.  Thank you for allowing us  to be a part of  your care.        Theadore Ozell HERO, MD 11/11/23 [phone]

## 2023-11-11 NOTE — ED Notes (Signed)
 Patient transported to X-ray

## 2023-11-11 NOTE — Discharge Instructions (Signed)
 You were evaluated in the Emergency Department and after careful evaluation, we did not find any emergent condition requiring admission or further testing in the hospital.  Your exam/testing today was overall reassuring.  Symptoms may be due to a viral illness.  Recommend continued rest, plenty of fluids, Tylenol  or Motrin  at home for discomfort.  Please return to the Emergency Department if you experience any worsening of your condition.  Thank you for allowing us  to be a part of your care.

## 2024-08-18 NOTE — Progress Notes (Unsigned)
 New Patient Note  RE: Joel [name] MRN: [id number] DOB: 06-06-1971 Date of Office Visit: 08/19/2024  Consult requested by: Charlott Dorn LABOR, * Primary care provider: Pcp, No  Chief Complaint: No chief complaint on file.  History of Present Illness: I had the pleasure of seeing Joel [name] for initial evaluation at the [facility] on 08/19/2024. He is a 53 y.o. male, who is referred here by Charlott Dorn LABOR, * for the evaluation of allergic rhinitis.  Discussed the use of AI scribe software for clinical note transcription with the patient, who gave verbal consent to proceed.  History of Present Illness             He reports symptoms of ***. Symptoms have been going on for *** years. The symptoms are present *** all year around with worsening in ***. Other triggers include exposure to ***. Anosmia: ***. Headache: ***. He has used *** with ***fair improvement in symptoms. Sinus infections: ***. Previous work up includes: ***. Previous ENT evaluation: ***. Previous sinus imaging: ***. History of nasal polyps: ***. Last eye exam: ***. History of reflux: ***.  Assessment and Plan: Joel [name] is a 53 y.o. male with: ***  Assessment and Plan               No follow-ups on file.  No orders of the defined types were placed in this encounter.  Lab Orders  No laboratory test(s) ordered today    Other allergy screening: Asthma: {Blank single:19197::yes,no} Rhino conjunctivitis: {Blank single:19197::yes,no} Food allergy: {Blank single:19197::yes,no} Medication allergy: {Blank single:19197::yes,no} Hymenoptera allergy: {Blank single:19197::yes,no} Urticaria: {Blank single:19197::yes,no} Eczema:{Blank single:19197::yes,no} History of recurrent infections suggestive of immunodeficency: {Blank single:19197::yes,no}  Diagnostics: Spirometry:  Tracings reviewed. His effort: {Blank single:19197::Good  reproducible efforts.,It was hard to get consistent efforts and there is a question as to whether this reflects a maximal maneuver.,Poor effort, data can not be interpreted.} FVC: ***L FEV1: ***L, ***% predicted FEV1/FVC ratio: ***% Interpretation: {Blank single:19197::Spirometry consistent with mild obstructive disease,Spirometry consistent with moderate obstructive disease,Spirometry consistent with severe obstructive disease,Spirometry consistent with possible restrictive disease,Spirometry consistent with mixed obstructive and restrictive disease,Spirometry uninterpretable due to technique,Spirometry consistent with normal pattern,No overt abnormalities noted given today's efforts}.  Please see scanned spirometry results for details.  Skin Testing: {Blank single:19197::Select foods,Environmental allergy panel,Environmental allergy panel and select foods,Food allergy panel,None,Deferred due to recent antihistamines use}. *** Results discussed with patient/family.   Past Medical History: Patient Active Problem List   Diagnosis Date Noted  . Patellar subluxation, left, subsequent encounter 03/19/2022  . Primary osteoarthritis of right hip 03/19/2022  . Routine general medical examination at a health care facility 02/10/2015  . Anxiety and depression 02/10/2015  . Essential hypertension 01/03/2015   Past Medical History:  Diagnosis Date  . Allergy   . Chicken pox   . Hypertension   . Kidney stones    Past Surgical History: Past Surgical History:  Procedure Laterality Date  . KNEE SURGERY    . TOTAL HIP ARTHROPLASTY Right 05/11/2022   Medication List:  Current Outpatient Medications  Medication Sig Dispense Refill  . benzonatate  (TESSALON ) 100 MG capsule Take 1 capsule (100 mg total) by mouth every 8 (eight) hours. 21 capsule 0  . cetirizine  (ZYRTEC ) 10 MG tablet TAKE 1 TABLET BY MOUTH ONCE DAILY 90 tablet 3  . fluticasone  (FLONASE ) 50 MCG/ACT  nasal spray Place 2 sprays into both nostrils daily. 18.2 mL 0  . HYDROcodone -acetaminophen  (NORCO/VICODIN) 5-325 MG tablet Take 1 tablet  by mouth every 6 (six) hours as needed. 20 tablet 0  . ibuprofen  (ADVIL ) 800 MG tablet Take 1 tablet (800 mg total) by mouth 3 (three) times daily. 21 tablet 0  . ondansetron  (ZOFRAN ) 4 MG tablet Take 1 tablet (4 mg total) by mouth every 8 (eight) hours as needed for nausea or vomiting. 20 tablet 0  . predniSONE  (DELTASONE ) 5 MG tablet Take 6 pills for first day, 5 pills second day, 4 pills third day, 3 pills fourth day, 2 pills the fifth day, and 1 pill sixth day. 21 tablet 0   No current facility-administered medications for this visit.   Allergies: Allergies  Allergen Reactions  . Penicillins Anaphylaxis   Social History: Social History   Socioeconomic History  . Marital status: Married    Spouse name: Not on file  . Number of children: 0  . Years of education: 53  . Highest education level: Not on file  Occupational History  . Occupation: Security  Tobacco Use  . Smoking status: Never  . Smokeless tobacco: Current    Types: Chew  Vaping Use  . Vaping status: Never Used  Substance and Sexual Activity  . Alcohol use: Yes    Comment: Rarely  . Drug use: No  . Sexual activity: Not Currently  Other Topics Concern  . Not on file  Social History Narrative   Born [place], [place] and raised in [place], [place]. Currently resides in an apartment. 2 dogs. Fun: Shoot   Denies religious beliefs that would effect health care.    Social Drivers of Corporate investment banker Strain: Not on file  Food Insecurity: Not on file  Transportation Needs: Not on file  Physical Activity: Not on file  Stress: Not on file  Social Connections: Not on file   Lives in a ***. Smoking: *** Occupation: ***  Environmental HistorySurveyor, minerals in the house: Network engineer in the family room: {Blank  single:19197::yes,no} Carpet in the bedroom: {Blank single:19197::yes,no} Heating: {Blank single:19197::electric,gas,heat pump} Cooling: {Blank single:19197::central,window,heat pump} Pet: {Blank single:19197::yes ***,no}  Family History: Family History  Problem Relation Age of Onset  . Heart attack Mother   . Ovarian cancer Mother   . Arthritis Mother   . Leukemia Mother   . Heart attack Father   . Heart attack Maternal Grandmother   . Arthritis Maternal Grandmother   . Diabetes Maternal Grandmother   . Hypertension Maternal Grandmother   . Stroke Paternal Grandmother    Problem                               Relation Asthma                                   *** Eczema                                *** Food allergy                          *** Allergic rhino conjunctivitis     ***  Review of Systems  Constitutional:  Negative for appetite change, chills, fever and unexpected weight change.  HENT:  Negative for congestion and rhinorrhea.   Eyes:  Negative for itching.  Respiratory:  Negative for  cough, chest tightness, shortness of breath and wheezing.   Cardiovascular:  Negative for chest pain.  Gastrointestinal:  Negative for abdominal pain.  Genitourinary:  Negative for difficulty urinating.  Skin:  Negative for rash.  Neurological:  Negative for headaches.    Objective: There were no vitals taken for this visit. There is no height or weight on file to calculate BMI. Physical Exam Vitals and nursing note reviewed.  Constitutional:      Appearance: Normal appearance. He is well-developed.  HENT:     Head: Normocephalic and atraumatic.     Right Ear: Tympanic membrane and external ear normal.     Left Ear: Tympanic membrane and external ear normal.     Nose: Nose normal.     Mouth/Throat:     Mouth: Mucous membranes are moist.     Pharynx: Oropharynx is clear.  Eyes:     Conjunctiva/sclera: Conjunctivae normal.  Cardiovascular:     Rate  and Rhythm: Normal rate and regular rhythm.     Heart sounds: Normal heart sounds. No murmur heard.    No friction rub. No gallop.  Pulmonary:     Effort: Pulmonary effort is normal.     Breath sounds: Normal breath sounds. No wheezing, rhonchi or rales.  Musculoskeletal:     Cervical back: Neck supple.  Skin:    General: Skin is warm.     Findings: No rash.  Neurological:     Mental Status: He is alert and oriented to person, place, and time.  Psychiatric:        Behavior: Behavior normal.   The plan was reviewed with the patient/family, and all questions/concerned were addressed.  It was my pleasure to see Joel [name] today and participate in his care. Please feel free to contact me with any questions or concerns.  Sincerely,  Orlan Cramp, DO Allergy & Immunology  [facility]  [facility] office: [phone] [facility] office: [phone]

## 2024-08-19 ENCOUNTER — Encounter: Payer: Self-pay | Admitting: Allergy

## 2024-08-19 ENCOUNTER — Ambulatory Visit (INDEPENDENT_AMBULATORY_CARE_PROVIDER_SITE_OTHER): Admitting: Allergy

## 2024-08-19 ENCOUNTER — Other Ambulatory Visit: Payer: Self-pay

## 2024-08-19 VITALS — BP 118/90 | HR 102 | Temp 98.3°F | Resp 16 | Ht 71.0 in | Wt 258.0 lb

## 2024-08-19 DIAGNOSIS — H1013 Acute atopic conjunctivitis, bilateral: Secondary | ICD-10-CM

## 2024-08-19 DIAGNOSIS — K219 Gastro-esophageal reflux disease without esophagitis: Secondary | ICD-10-CM | POA: Diagnosis not present

## 2024-08-19 DIAGNOSIS — Z91038 Other insect allergy status: Secondary | ICD-10-CM | POA: Diagnosis not present

## 2024-08-19 DIAGNOSIS — J3089 Other allergic rhinitis: Secondary | ICD-10-CM

## 2024-08-19 DIAGNOSIS — Z880 Allergy status to penicillin: Secondary | ICD-10-CM

## 2024-08-19 DIAGNOSIS — J328 Other chronic sinusitis: Secondary | ICD-10-CM

## 2024-08-19 MED ORDER — EPINEPHRINE 0.3 MG/0.3ML IJ SOAJ: 0.3000 mg | INTRAMUSCULAR | 1 refills | Status: AC | PRN

## 2024-08-19 MED ORDER — XHANCE 93 MCG/ACT NA EXHU: INHALANT_SUSPENSION | NASAL | 5 refills | Status: AC

## 2024-08-19 NOTE — Patient Instructions (Addendum)
 Rhinitis  Return for allergy skin testing. Will make additional recommendations based on results. If significant positives will recommend allergy injections - handout given. If negative will refer to ENT.  Make sure you don't take any antihistamines for 3 days before the skin testing appointment. Don't put any lotion on the back and arms on the day of testing.  Must be in good health and not ill. No vaccines/injections/antibiotics within the past 7 days.  Plan on being here for 30-60 minutes.  Start Xhance  (fluticasone ) nasal spray 1-2 sprays per nostril twice a day as needed for nasal congestion.  Sample given and demonstrated proper use. If this is not covered let us  know.  This will be mailed to you from [facility] pharmacy [phone]).  Stinging insect Continue to avoid. Get bloodwork at next visit. I have prescribed epinephrine injectable device and demonstrated proper use. For mild symptoms you can take over the counter antihistamines (zyrtec  10mg  to 20mg ) and monitor symptoms closely.  If symptoms worsen or if you have severe symptoms including breathing issues, throat closure, significant swelling, whole body hives, severe diarrhea and vomiting, lightheadedness then use epinephrine and seek immediate medical care afterwards. Emergency action plan given.  Penicillin allergy: Continue to avoid penicillin type of antibiotics.   Reflux See handout for lifestyle and dietary modifications. Continue reflux meds as prescribed.   Follow up for skin testing.

## 2024-08-27 ENCOUNTER — Other Ambulatory Visit (HOSPITAL_COMMUNITY): Payer: Self-pay | Admitting: Internal Medicine

## 2024-08-27 DIAGNOSIS — R7989 Other specified abnormal findings of blood chemistry: Secondary | ICD-10-CM

## 2024-08-30 NOTE — Progress Notes (Unsigned)
 Skin testing note  RE: Joel [name] MRN: [id number] DOB: January 24, 1971 Date of Office Visit: 08/31/2024  Referring provider: Charlott Dorn [name], * Primary care provider: Charlott Dorn LABOR, MD  Chief Complaint: skin testing  History of Present Illness: I had the pleasure of seeing Joel [name] for a skin testing visit at the [facility] on 08/31/2024. He is a 53 y.o. male, who is being followed for allergic rhinoconjunctivitis, hymenoptera allergy, penicillin allergy and GERD. His previous allergy office visit was on 08/19/2024 [name] Joel [name]. Today is a skin testing visit.   Discussed the use of AI scribe software for clinical note transcription [name], who gave verbal consent to proceed.     He has confirmed allergies to tree pollen, grass pollen, mold, dust mite, cockroach, cat, and dog. He lives [name] a dog and has guinea pigs.  He recalls receiving allergy shots in the past. He is currently managing his allergies [name] over-the-counter antihistamines and a nasal spray, which he uses regularly. He has been prescribed an Epipen due to his bee sting allergies.     Assessment and Plan: Joel [name] is a 53 y.o. male [name]: Other allergic rhinitis Allergic conjunctivitis of both eyes Past history - Chronic symptoms of nasal congestion, sneezing, rhinorrhea, and itchy, watery eyes. Symptoms worsen [name] outdoor activities. Previous immunotherapy effective as a child/teenager. OTC antihistamines and nasal sprays ineffective. 1 dog, 5 guinea pigs at home. Today's skin testing positive to grass, trees, mold, dust mites, cockroach, cat and dog. Start environmental control measures as below. Use over the counter antihistamines such as Zyrtec  (cetirizine ), Claritin (loratadine), Allegra (fexofenadine), or Xyzal (levocetirizine) daily as needed. May take twice a day during allergy flares. May switch antihistamines every few months. Start Singulair (montelukast) 10mg   daily at night. Cautioned that in some children/adults can experience behavioral changes including hyperactivity, agitation, depression, sleep disturbances and suicidal ideations. These side effects are rare, but if you notice them you should notify me and discontinue Singulair (montelukast). Recommend allergy injections. 2 injections.  Let us  know when ready to start.  Had a detailed discussion [name] patient/family that clinical history is suggestive of allergic rhinitis, and may benefit from allergy immunotherapy (AIT). Discussed in detail regarding the dosing, schedule, side effects (mild to moderate local allergic reaction and rarely systemic allergic reactions including anaphylaxis), and benefits (significant improvement in nasal symptoms, seasonal flares of asthma) of immunotherapy [name]. There is significant time commitment involved [name] allergy shots, which includes weekly immunotherapy injections for first 9-12 months and then biweekly to monthly injections for 3-5 years. Consent was signed. Start Xhance  (fluticasone ) nasal spray 1-2 sprays per nostril twice a day as needed for nasal congestion.  If this is not covered let us  know.  This will be mailed to you from [facility] pharmacy [phone]).   Hymenoptera allergy Past history - Throat tightness in the past but no prior work up or Epipen Rx. Continue to avoid. For mild symptoms you can take over the counter antihistamines (zyrtec  10mg  to 20mg ) and monitor symptoms closely.  If symptoms worsen or if you have severe symptoms including breathing issues, throat closure, significant swelling, whole body hives, severe diarrhea and vomiting, lightheadedness then use epinephrine and seek immediate medical care afterwards. Emergency action plan given.  Get bloodwork If significant positives - there is allergy shots for these as well.   Penicillin allergy Past history - Severe reaction reaction to penicillin as a child requiring  hospitalization/intubation?  Continue to avoid penicillin type of antibiotics.    Gastroesophageal reflux disease, unspecified whether esophagitis present Continue lifestyle and dietary modifications. Continue reflux meds as prescribed.    Return in about 6 months (around 03/01/2025).  Meds ordered this encounter  Medications   montelukast (SINGULAIR) 10 MG tablet    Sig: Take 1 tablet (10 mg total) by mouth at bedtime.    Dispense:  30 tablet    Refill:  5   Lab Orders         Allergen Fire Ant         Hymenoptera Venom Allergy II      Diagnostics: Skin Testing: Environmental allergy panel. Today's skin testing positive to grass, trees, mold, dust mites, cockroach, cat and dog. Results discussed [name] patient/family.  Airborne Adult Perc - 08/31/24 1411     Time Antigen Placed 1411    Allergen Manufacturer Jestine    Location Back    Number of Test 55    1. Control-Buffer 50% Glycerol Negative    2. Control-Histamine 2+    3. Bahia Negative    4. Bermuda Negative    5. Johnson 2+    6. [place]  Blue 3+    7. Meadow Fescue 2+    8. Perennial Rye 2+    9. Timothy 3+    10. Ragweed Mix Negative    11. Cocklebur Negative    12. Plantain,  English Negative    13. Baccharis Negative    14. Dog Fennel Negative    15. Russian Thistle Negative    16. Lamb's Quarters Negative    17. Sheep Sorrell Negative    18. Rough Pigweed Negative    19. Marsh Elder, Rough Negative    20. Mugwort, Common Negative    21. Box, Elder Negative    22. Cedar, red Negative    23. Sweet Gum 2+    24. Pecan Pollen --   +/-   25. Pine Mix Negative    26. Walnut, Black Pollen Negative    27. Red Mulberry Negative    28. Ash Mix Negative    29. Birch Mix 3+    30. Beech American 2+    31. Cottonwood, Eastern Negative    32. Hickory, White Negative    33. Maple Mix Negative    34. Oak, Eastern Mix 2+    35. Sycamore Eastern Negative    36. Alternaria Alternata Negative    37. Cladosporium  Herbarum Negative    38. Aspergillus Mix Negative    39. Penicillium Mix Negative    40. Bipolaris Sorokiniana (Helminthosporium) 2+    41. Drechslera Spicifera (Curvularia) Negative    42. Mucor Plumbeus Negative    43. Fusarium Moniliforme --   +/-   44. Aureobasidium Pullulans (pullulara) Negative    45. Rhizopus Oryzae Negative    46. Botrytis Cinera Negative    47. Epicoccum Nigrum Negative    48. Phoma Betae 2+    49. Dust Mite Mix 4+    50. Cat Hair 10,000 BAU/ml Negative    51.  Dog Epithelia Negative    52. Mixed Feathers Negative    53. Horse Epithelia Negative    54. Cockroach, German 2+    55. Tobacco Leaf Negative          Intradermal - 08/31/24 1436     Time Antigen Placed 1437    Allergen Manufacturer Jestine    Location Arm    Number of Test  9    Control Negative    Bahia 4+    Bermuda 4+    Ragweed Mix Negative    Weed Mix Negative    Mold 1 2+    Mold 2 3+    Cat 2+    Dog 3+          Previous notes and tests were reviewed. The plan was reviewed [name]/family, and all questions/concerned were addressed.  It was my pleasure to see Derril today and participate in his care. Please feel free to contact me [name] any questions or concerns.  Sincerely,  Orlan Cramp, DO Allergy & Immunology  [facility]  [facility] office: [phone] [facility] office: [phone]

## 2024-08-31 ENCOUNTER — Ambulatory Visit (INDEPENDENT_AMBULATORY_CARE_PROVIDER_SITE_OTHER): Admitting: Allergy

## 2024-08-31 ENCOUNTER — Encounter: Payer: Self-pay | Admitting: Allergy

## 2024-08-31 DIAGNOSIS — J3089 Other allergic rhinitis: Secondary | ICD-10-CM | POA: Diagnosis not present

## 2024-08-31 DIAGNOSIS — Z880 Allergy status to penicillin: Secondary | ICD-10-CM

## 2024-08-31 DIAGNOSIS — H1013 Acute atopic conjunctivitis, bilateral: Secondary | ICD-10-CM

## 2024-08-31 DIAGNOSIS — Z91038 Other insect allergy status: Secondary | ICD-10-CM

## 2024-08-31 DIAGNOSIS — K219 Gastro-esophageal reflux disease without esophagitis: Secondary | ICD-10-CM

## 2024-08-31 MED ORDER — MONTELUKAST SODIUM 10 MG PO TABS: 10.0000 mg | ORAL_TABLET | Freq: Every day | ORAL | 5 refills | Status: AC

## 2024-08-31 NOTE — Patient Instructions (Addendum)
 Today's skin testing positive to grass, trees, mold, dust mites, cockroach, cat and dog.  Results given.  Environmental allergies Start environmental control measures as below. Use over the counter antihistamines such as Zyrtec  (cetirizine ), Claritin (loratadine), Allegra (fexofenadine), or Xyzal (levocetirizine) daily as needed. May take twice a day during allergy flares. May switch antihistamines every few months. Start Singulair (montelukast) 10mg  daily at night. Cautioned that in some children/adults can experience behavioral changes including hyperactivity, agitation, depression, sleep disturbances and suicidal ideations. These side effects are rare, but if you notice them you should notify me and discontinue Singulair (montelukast).  Recommend allergy injections. 2 injections.  Let us  know when ready to start.  Had a detailed discussion with patient/family that clinical history is suggestive of allergic rhinitis, and may benefit from allergy immunotherapy (AIT). Discussed in detail regarding the dosing, schedule, side effects (mild to moderate local allergic reaction and rarely systemic allergic reactions including anaphylaxis), and benefits (significant improvement in nasal symptoms, seasonal flares of asthma) of immunotherapy with the patient. There is significant time commitment involved with allergy shots, which includes weekly immunotherapy injections for first 9-12 months and then biweekly to monthly injections for 3-5 years. Consent was signed.  Start Xhance  (fluticasone ) nasal spray 1-2 sprays per nostril twice a day as needed for nasal congestion.  If this is not covered let us  know.  This will be mailed to you from [facility] pharmacy [phone]).  Stinging insect Continue to avoid. For mild symptoms you can take over the counter antihistamines (zyrtec  10mg  to 20mg ) and monitor symptoms closely.  If symptoms worsen or if you have severe symptoms including breathing issues, throat  closure, significant swelling, whole body hives, severe diarrhea and vomiting, lightheadedness then use epinephrine and seek immediate medical care afterwards. Emergency action plan given.  Get bloodwork If significant positives - there is allergy shots for these as well.  We are ordering labs, so please allow 1-2 weeks for the results to come back. With the newly implemented Cures Act, the labs might be visible to you at the same time that they become visible to me. However, I will not address the results until all of the results are back, so please be patient.  In the meantime, continue recommendations in your patient instructions, including avoidance measures (if applicable), until you hear from me.  Penicillin allergy: Continue to avoid penicillin type of antibiotics.   Reflux Continue lifestyle and dietary modifications. Continue reflux meds as prescribed.   Follow up in 6 months or sooner if needed.   Reducing Pollen Exposure Pollen seasons: trees (spring), grass (summer) and ragweed/weeds (fall). Keep windows closed in your home and car to lower pollen exposure.  Install air conditioning in the bedroom and throughout the house if possible.  Avoid going out in dry windy days - especially early morning. Pollen counts are highest between 5 - 10 AM and on dry, hot and windy days.  Save outside activities for late afternoon or after a heavy rain, when pollen levels are lower.  Avoid mowing of grass if you have grass pollen allergy. Be aware that pollen can also be transported indoors on people and pets.  Dry your clothes in an automatic dryer rather than hanging them outside where they might collect pollen.  Rinse hair and eyes before bedtime. Mold Control Mold and fungi can grow on a variety of surfaces provided certain temperature and moisture conditions exist.  Outdoor molds grow on plants, decaying vegetation and soil. The major outdoor mold, Alternaria  and Cladosporium, are found  in very high numbers during hot and dry conditions. Generally, a late summer - fall peak is seen for common outdoor fungal spores. Rain will temporarily lower outdoor mold spore count, but counts rise rapidly when the rainy period ends. The most important indoor molds are Aspergillus and Penicillium. Dark, humid and poorly ventilated basements are ideal sites for mold growth. The next most common sites of mold growth are the bathroom and the kitchen. Outdoor (Seasonal) Mold Control Use air conditioning and keep windows closed. Avoid exposure to decaying vegetation. Avoid leaf raking. Avoid grain handling. Consider wearing a face mask if working in moldy areas.  Indoor (Perennial) Mold Control  Maintain humidity below 50%. Get rid of mold growth on hard surfaces with water, detergent and, if necessary, 5% bleach (do not mix with other cleaners). Then dry the area completely. If mold covers an area more than 10 square feet, consider hiring an indoor environmental professional. For clothing, washing with soap and water is best. If moldy items cannot be cleaned and dried, throw them away. Remove sources e.g. contaminated carpets. Repair and seal leaking roofs or pipes. Using dehumidifiers in damp basements may be helpful, but empty the water and clean units regularly to prevent mildew from forming. All rooms, especially basements, bathrooms and kitchens, require ventilation and cleaning to deter mold and mildew growth. Avoid carpeting on concrete or damp floors, and storing items in damp areas. Control of House Dust Mite Allergen Dust mite allergens are a common trigger of allergy and asthma symptoms. While they can be found throughout the house, these microscopic creatures thrive in warm, humid environments such as bedding, upholstered furniture and carpeting. Because so much time is spent in the bedroom, it is essential to reduce mite levels there.  Encase pillows, mattresses, and box springs in  special allergen-proof fabric covers or airtight, zippered plastic covers.  Bedding should be washed weekly in hot water (130 F) and dried in a hot dryer. Allergen-proof covers are available for comforters and pillows that can't be regularly washed.  Wash the allergy-proof covers every few months. Minimize clutter in the bedroom. Keep pets out of the bedroom.  Keep humidity less than 50% by using a dehumidifier or air conditioning. You can buy a humidity measuring device called a hygrometer to monitor this.  If possible, replace carpets with hardwood, linoleum, or washable area rugs. If that's not possible, vacuum frequently with a vacuum that has a HEPA filter. Remove all upholstered furniture and non-washable window drapes from the bedroom. Remove all non-washable stuffed toys from the bedroom.  Wash stuffed toys weekly. Pet Allergen Avoidance: Contrary to popular opinion, there are no "hypoallergenic" breeds of dogs or cats. That is because people are not allergic to an animal's hair, but to an allergen found in the animal's saliva, dander (dead skin flakes) or urine. Pet allergy symptoms typically occur within minutes. For some people, symptoms can build up and become most severe 8 to 12 hours after contact with the animal. People with severe allergies can experience reactions in public places if dander has been transported on the pet owners' clothing. Keeping an animal outdoors is only a partial solution, since homes with pets in the yard still have higher concentrations of animal allergens. Before getting a pet, ask your allergist to determine if you are allergic to animals. If your pet is already considered part of your family, try to minimize contact and keep the pet out of the bedroom and other  rooms where you spend a great deal of time. As with dust mites, vacuum carpets often or replace carpet with a hardwood floor, tile or linoleum. High-efficiency particulate air (HEPA) cleaners can reduce  allergen levels over time. While dander and saliva are the source of cat and dog allergens, urine is the source of allergens from rabbits, hamsters, mice and guinea pigs; so ask a non-allergic family member to clean the animal's cage. If you have a pet allergy, talk to your allergist about the potential for allergy immunotherapy (allergy shots). This strategy can often provide long-term relief. Cockroach Allergen Avoidance Cockroaches are often found in the homes of densely populated urban areas, schools or commercial buildings, but these creatures can lurk almost anywhere. This does not mean that you have a dirty house or living area. Block all areas where roaches can enter the home. This includes crevices, wall cracks and windows.  Cockroaches need water to survive, so fix and seal all leaky faucets and pipes. Have an exterminator go through the house when your family and pets are gone to eliminate any remaining roaches. Keep food in lidded containers and put pet food dishes away after your pets are done eating. Vacuum and sweep the floor after meals, and take out garbage and recyclables. Use lidded garbage containers in the kitchen. Wash dishes immediately after use and clean under stoves, refrigerators or toasters where crumbs can accumulate. Wipe off the stove and other kitchen surfaces and cupboards regularly.

## 2024-09-01 ENCOUNTER — Ambulatory Visit (HOSPITAL_COMMUNITY)
Admission: RE | Admit: 2024-09-01 | Discharge: 2024-09-01 | Disposition: A | Discharge status: Home / Self Care | Source: Ambulatory Visit | Attending: Internal Medicine | Admitting: Internal Medicine

## 2024-09-01 DIAGNOSIS — R7989 Other specified abnormal findings of blood chemistry: Secondary | ICD-10-CM | POA: Diagnosis present

## 2024-09-01 MED ORDER — GADOBUTROL 1 MMOL/ML IV SOLN
10.0000 mL | Freq: Once | INTRAVENOUS | Status: AC | PRN
Start: 2024-09-01 — End: 2024-09-01
  Administered 2024-09-01: 10 mL via INTRAVENOUS

## 2024-09-03 ENCOUNTER — Telehealth: Payer: Self-pay

## 2024-09-03 ENCOUNTER — Telehealth: Payer: Self-pay | Admitting: Pharmacist

## 2024-09-03 LAB — HYMENOPTERA VENOM ALLERGY II

## 2024-09-03 NOTE — Telephone Encounter (Signed)
 Appeal has been submitted. Will advise when response is received, please be advised that most companies may take 30 days to make a decision. Appeal letter and supporting documentation have been faxed to [phone] on 09/03/2024 @3 :38 pm.  Thank you, Devere Pandy, PharmD Clinical Pharmacist  [place]  Direct Dial: [phone]

## 2024-09-03 NOTE — Telephone Encounter (Signed)
 The requested medication and/or diagnosis are not a covered benefit and excluded from coverage in accordance with the terms and conditions of your plan benefit. Therefore, the request has been administratively denied. If applicable, below are some alternative medications to consider: FLUTICASONE  PROPIONATE

## 2024-09-03 NOTE — Telephone Encounter (Signed)
*  AA  Pharmacy Patient Advocate Encounter   Received notification from CoverMyMeds that prior authorization for Xhance  93MCG/ACT exhaler suspension  is required/requested.   Insurance verification completed.   The patient is insured through [facility].   Per test claim: PA required; PA submitted to above mentioned insurance via [employer]/confirmation #/[id number] Status is pending

## 2024-09-03 NOTE — Telephone Encounter (Signed)
 Submission included previous trial of Flonase .   Information has been sent to clinical pharmacist for appeals review. It may take 5-7 days to prepare the necessary documentation to request the appeal from the insurance.

## 2024-09-05 LAB — HYMENOPTERA VENOM ALLERGY II
Bumblebee: <0.1 kU/L
Hornet, White Face, IgE: 0.12 kU/L — AB
Hornet, Yellow, IgE: <0.1 kU/L
I001-IgE Honeybee: <0.1 kU/L
I003-IgE Yellow Jacket: <0.1 kU/L
I004-IgE Paper Wasp: <0.1 kU/L
I208-IgE Api m 1: <0.1 kU/L
I209-IgE Ves v 5: <0.1 kU/L
I210-IgE Pol d 5: <0.1 kU/L
I211-IgE Ves v 1: <0.1 kU/L
I214-IgE Api m 2: <0.1 kU/L
I215-IgE Api m 3: <0.1 kU/L
I216-IgE Api m 5: <0.1 kU/L
I217-IgE Api m 10: <0.1 kU/L
Tryptase: 6.1 ug/L (ref 2.2–13.2)

## 2024-09-05 LAB — ALLERGEN COMPONENT COMMENTS

## 2024-09-05 LAB — ALLERGEN FIRE ANT: I070-IgE Fire Ant (Invicta): 0.13 kU/L — AB

## 2024-09-06 ENCOUNTER — Ambulatory Visit: Payer: Self-pay | Admitting: Allergy

## 2024-09-15 ENCOUNTER — Other Ambulatory Visit (HOSPITAL_COMMUNITY): Payer: Self-pay

## 2024-09-25 ENCOUNTER — Other Ambulatory Visit (HOSPITAL_COMMUNITY): Payer: Self-pay

## 2024-09-28 ENCOUNTER — Other Ambulatory Visit (HOSPITAL_COMMUNITY): Payer: Self-pay

## 2024-09-28 NOTE — Telephone Encounter (Signed)
 Appeal was denied by the insurance, full letter can be found under the media tab.

## 2024-10-18 ENCOUNTER — Other Ambulatory Visit: Payer: Self-pay | Admitting: Medical Genetics

## 2024-10-24 ENCOUNTER — Other Ambulatory Visit
Admission: RE | Admit: 2024-10-24 | Discharge: 2024-10-24 | Disposition: A | Discharge status: Home / Self Care | Payer: Self-pay | Source: Ambulatory Visit | Attending: Medical Genetics | Admitting: Medical Genetics

## 2024-11-10 LAB — GENECONNECT MOLECULAR SCREEN: Genetic Analysis Overall Interpretation: NEGATIVE

## 2025-03-01 ENCOUNTER — Ambulatory Visit: Admitting: Allergy
